# Patient Record
Sex: Female | Born: 1995
Health system: Southern US, Community
[De-identification: ages and names within clinical notes are randomized; demographics above are authoritative.]

## PROBLEM LIST (undated history)

## (undated) DIAGNOSIS — Z789 Other specified health status: Secondary | ICD-10-CM

## (undated) HISTORY — PX: NO PAST SURGERIES: SHX2092

## (undated) HISTORY — DX: Other specified health status: Z78.9

---
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
## 2017-06-19 ENCOUNTER — Ambulatory Visit: Payer: Self-pay | Admitting: Family Medicine

## 2017-06-28 ENCOUNTER — Ambulatory Visit (INDEPENDENT_AMBULATORY_CARE_PROVIDER_SITE_OTHER): Payer: PRIVATE HEALTH INSURANCE | Admitting: Family Medicine

## 2017-06-28 ENCOUNTER — Encounter: Payer: Self-pay | Admitting: Family Medicine

## 2017-06-28 VITALS — BP 90/62 | HR 77 | Temp 98.1°F | Ht 62.5 in | Wt 126.8 lb

## 2017-06-28 DIAGNOSIS — Z0000 Encounter for general adult medical examination without abnormal findings: Secondary | ICD-10-CM

## 2017-06-28 DIAGNOSIS — Z23 Encounter for immunization: Secondary | ICD-10-CM

## 2017-06-28 NOTE — Progress Notes (Addendum)
Subjective:     Peggy Becker is a 22 y.o. female and is here for a comprehensive physical exam. The patient reports no problems.  Patient states she is overall healthy.  States she really does not have her regular physician as she has not needed one.  Pt has high school diploma and is currently in school at [employer].  She is hoping to become a CNA.  She has 1 more year of school left.  Patient has a physical form that needs to be completed for school.  She also is in need of starting immunizations required for school.  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  She denies alcohol or tobacco use.  Patient endorses recreational drug use.  LMP May 31, 2017.  Family medical history: Mom-alive Dad-alive Sister-Courtney, alive MGM-alive, breast cancer MGF-alive PGM-deceased, breast cancer PGF-alive   Social History   Socioeconomic History  . Marital status: Single    Spouse name: Not on file  . Number of children: Not on file  . Years of education: Not on file  . Highest education level: Not on file  Social Needs  . Financial resource strain: Not on file  . Food insecurity - worry: Not on file  . Food insecurity - inability: Not on file  . Transportation needs - medical: Not on file  . Transportation needs - non-medical: Not on file  Occupational History  . Not on file  Tobacco Use  . Smoking status: Never Smoker  . Smokeless tobacco: Never Used  Substance and Sexual Activity  . Alcohol use: No    Frequency: Never  . Drug use: No  . Sexual activity: Not on file  Other Topics Concern  . Not on file  Social History Narrative  . Not on file   Health Maintenance  Topic Date Due  . HIV Screening  12/02/2011  . TETANUS/TDAP  12/02/2015  . INFLUENZA VACCINE  12/14/2016    The following portions of the patient's history were reviewed and updated as appropriate:  allergies, current medications, past family history, past medical history, past social history, past surgical history and problem list.  Review of Systems A comprehensive review of systems was negative.   Objective:    BP 90/62 (BP Location: Left Arm, Patient Position: Sitting, Cuff Size: Normal)   Pulse 77   Temp 98.1 F (36.7 C) (Oral)   Ht 5' 2.5" (1.588 m)   Wt 126 lb 12.8 oz (57.5 kg)   BMI 22.82 kg/m  General appearance: alert, cooperative and no distress Head: Normocephalic, without obvious abnormality, atraumatic Eyes: conjunctivae/corneas clear. PERRL, EOM's intact. Fundi benign. Ears: normal TM's and external ear canals both ears Nose: Nares normal. Septum midline. Mucosa normal. No drainage or sinus tenderness. Throat: lips, mucosa, and tongue normal; teeth and gums normal Neck: no adenopathy, no carotid bruit, no JVD, supple, symmetrical, trachea midline and thyroid not enlarged, symmetric, no tenderness/mass/nodules Lungs: clear to auscultation bilaterally Heart: regular rate and rhythm, S1, S2 normal, no murmur, click, rub or gallop Abdomen: soft, non-tender; bowel sounds normal; no masses,  no organomegaly Pelvic: deferred Extremities: extremities normal, atraumatic, no cyanosis or edema Pulses: 2+ and symmetric Skin: Skin color, texture, turgor normal. No rashes or lesions Lymph nodes: Cervical, supraclavicular, and axillary nodes normal. Neurologic: Alert and oriented X 3, normal strength and tone. Normal symmetric reflexes. Normal coordination and gait    Assessment:  Healthy female exam.  Without abnormal findings.     Plan:      Anticipatory guidance given including wearing seatbelts, smoke detectors in the home, increasing p.o. intake of water, increasing physical activity, increasing p.o. intake of vegetables -Immunizations reviewed.  Patient needs influenza vaccine, meningococcal vaccine and Quantiferon Gold for school.  Will administer  today -Physical forms completed for patient school -Pap not indicated See After Visit Summary for Counseling Recommendations    Follow-up PRN, sooner if needed.  Abbe AmsterdamShannon Denver Harder, MD

## 2017-06-28 NOTE — Addendum Note (Signed)
Addended by: Abbe AmsterdamBANKS, Dimonique Bourdeau R on: 06/28/2017 04:44 PM   Modules accepted: Orders

## 2017-06-28 NOTE — Patient Instructions (Addendum)
Preventive Care 18-39 Years, Female Preventive care refers to lifestyle choices and visits with your health care provider that can promote health and wellness. What does preventive care include?  A yearly physical exam. This is also called an annual well check.  Dental exams once or twice a year.  Routine eye exams. Ask your health care provider how often you should have your eyes checked.  Personal lifestyle choices, including: ? Daily care of your teeth and gums. ? Regular physical activity. ? Eating a healthy diet. ? Avoiding tobacco and drug use. ? Limiting alcohol use. ? Practicing safe sex. ? Taking vitamin and mineral supplements as recommended by your health care provider. What happens during an annual well check? The services and screenings done by your health care provider during your annual well check will depend on your age, overall health, lifestyle risk factors, and family history of disease. Counseling Your health care provider may ask you questions about your:  Alcohol use.  Tobacco use.  Drug use.  Emotional well-being.  Home and relationship well-being.  Sexual activity.  Eating habits.  Work and work Statistician.  Method of birth control.  Menstrual cycle.  Pregnancy history.  Screening You may have the following tests or measurements:  Height, weight, and BMI.  Diabetes screening. This is done by checking your blood sugar (glucose) after you have not eaten for a while (fasting).  Blood pressure.  Lipid and cholesterol levels. These may be checked every 5 years starting at age 66.  Skin check.  Hepatitis C blood test.  Hepatitis B blood test.  Sexually transmitted disease (STD) testing.  BRCA-related cancer screening. This may be done if you have a family history of breast, ovarian, tubal, or peritoneal cancers.  Pelvic exam and Pap test. This may be done every 3 years starting at age 40. Starting at age 59, this may be done every 5  years if you have a Pap test in combination with an HPV test.  Discuss your test results, treatment options, and if necessary, the need for more tests with your health care provider. Vaccines Your health care provider may recommend certain vaccines, such as:  Influenza vaccine. This is recommended every year.  Tetanus, diphtheria, and acellular pertussis (Tdap, Td) vaccine. You may need a Td booster every 10 years.  Varicella vaccine. You may need this if you have not been vaccinated.  HPV vaccine. If you are 69 or younger, you may need three doses over 6 months.  Measles, mumps, and rubella (MMR) vaccine. You may need at least one dose of MMR. You may also need a second dose.  Pneumococcal 13-valent conjugate (PCV13) vaccine. You may need this if you have certain conditions and were not previously vaccinated.  Pneumococcal polysaccharide (PPSV23) vaccine. You may need one or two doses if you smoke cigarettes or if you have certain conditions.  Meningococcal vaccine. One dose is recommended if you are age 27-21 years and a first-year college student living in a residence hall, or if you have one of several medical conditions. You may also need additional booster doses.  Hepatitis A vaccine. You may need this if you have certain conditions or if you travel or work in places where you may be exposed to hepatitis A.  Hepatitis B vaccine. You may need this if you have certain conditions or if you travel or work in places where you may be exposed to hepatitis B.  Haemophilus influenzae type b (Hib) vaccine. You may need this if  you have certain risk factors.  Talk to your health care provider about which screenings and vaccines you need and how often you need them. This information is not intended to replace advice given to you by your health care provider. Make sure you discuss any questions you have with your health care provider. Document Released: 06/28/2001 Document Revised: 01/20/2016  Document Reviewed: 03/03/2015 Elsevier Interactive Patient Education  [employer].

## 2017-06-28 NOTE — Addendum Note (Signed)
Addended by: Dominic PeaHOLSEY, Tajuana Kniskern V on: 06/28/2017 05:29 PM   Modules accepted: Orders

## 2017-06-30 LAB — QUANTIFERON-TB GOLD PLUS
Mitogen-NIL: >10 IU/mL
NIL: 0.05 IU/mL
QuantiFERON-TB Gold Plus: POSITIVE — AB
TB1-NIL: 0.2 IU/mL
TB2-NIL: 0.52 IU/mL

## 2017-07-03 ENCOUNTER — Telehealth: Payer: Self-pay | Admitting: Family Medicine

## 2017-07-03 NOTE — Telephone Encounter (Signed)
Copied from CRM [id number]. Topic: Quick Communication - Lab Results >> Jul 03, 2017 11:59 AM Stephannie LiSimmons, Tamma Brigandi L, NT wrote: Patient called for lab results  please call her at [phone] triage may disclose

## 2017-07-03 NOTE — Telephone Encounter (Addendum)
Attempted to call patient, left VM to return call for lab results. Results are in result notes.

## 2018-01-18 ENCOUNTER — Telehealth: Payer: Self-pay | Admitting: Family Medicine

## 2018-01-18 NOTE — Telephone Encounter (Unsigned)
Copied from CRM [id number]. Topic: Appointment Scheduling - Scheduling Inquiry for Clinic >> Jan 18, 2018  4:31 PM Baldo Daub L wrote: Reason for CRM:   Pt calling wanting to schedule a PPD test.  Pt can be reached at [phone] >> Jan 18, 2018  4:55 PM Nimmons, Amie Critchley, RN wrote: I spoke with pt, she claims she needs other vaccines, can we verify which ones she needs.

## 2018-01-22 NOTE — Telephone Encounter (Signed)
Called pt left a message to return call in the office to schedule a nurse visits for PPD/Vacccines

## 2018-01-24 ENCOUNTER — Ambulatory Visit: Payer: BLUE CROSS/BLUE SHIELD

## 2018-01-24 NOTE — Telephone Encounter (Signed)
Pt has been scheduled for the PPD/ Vaccines at [facility] location

## 2018-01-31 ENCOUNTER — Encounter: Payer: Self-pay | Admitting: Family Medicine

## 2018-01-31 ENCOUNTER — Ambulatory Visit (INDEPENDENT_AMBULATORY_CARE_PROVIDER_SITE_OTHER): Payer: BLUE CROSS/BLUE SHIELD | Admitting: Family Medicine

## 2018-01-31 VITALS — BP 100/68 | HR 82 | Temp 98.4°F | Wt 125.0 lb

## 2018-01-31 DIAGNOSIS — R7612 Nonspecific reaction to cell mediated immunity measurement of gamma interferon antigen response without active tuberculosis: Secondary | ICD-10-CM | POA: Diagnosis not present

## 2018-01-31 DIAGNOSIS — Z029 Encounter for administrative examinations, unspecified: Secondary | ICD-10-CM | POA: Diagnosis not present

## 2018-01-31 DIAGNOSIS — Z111 Encounter for screening for respiratory tuberculosis: Secondary | ICD-10-CM | POA: Diagnosis not present

## 2018-01-31 DIAGNOSIS — Z23 Encounter for immunization: Secondary | ICD-10-CM | POA: Diagnosis not present

## 2018-01-31 NOTE — Progress Notes (Signed)
Subjective:    Patient ID: Peggy Becker, female    DOB: [birth year], 22 y.o.   MRN: [id number]  No chief complaint on file.   HPI Patient was seen today for immunizations for school and paperwork.  History reviewed. No pertinent past medical history.  No Known Allergies  ROS General: Denies fever, chills, night sweats, changes in weight, changes in appetite HEENT: Denies headaches, ear pain, changes in vision, rhinorrhea, sore throat CV: Denies CP, palpitations, SOB, orthopnea Pulm: Denies SOB, cough, wheezing GI: Denies abdominal pain, nausea, vomiting, diarrhea, constipation GU: Denies dysuria, hematuria, frequency, vaginal discharge Msk: Denies muscle cramps, joint pains Neuro: Denies weakness, numbness, tingling Skin: Denies rashes, bruising Psych: Denies depression, anxiety, hallucinations     Objective:    Blood pressure 100/68, pulse 82, temperature 98.4 F (36.9 C), temperature source Oral, weight 125 lb (56.7 kg), SpO2 98 %.   Gen. Pleasant, well-nourished, in no distress, normal affect   Lungs: no accessory muscle use Cardiovascular: RRR, noperipheral edema Neuro:  A&Ox3, CN II-XII intact, normal gait  Wt Readings from Last 3 Encounters:  01/31/18 125 lb (56.7 kg)  06/28/17 126 lb 12.8 oz (57.5 kg)    No results found for: WBC, HGB, HCT, PLT, GLUCOSE, CHOL, TRIG, HDL, LDLDIRECT, LDLCALC, ALT, AST, NA, K, CL, CREATININE, BUN, CO2, TSH, PSA, INR, GLUF, HGBA1C, MICROALBUR  Assessment/Plan:  Encounters for administrative purpose -will complete immunization record form after results of Quantiferon Gold test. -pt given original form and copy placed in provider's box.  Need for immunization against influenza  - Plan: Flu Vaccine QUAD 36+ mos IM  Need for 23-polyvalent pneumococcal polysaccharide vaccine  - Plan: Pneumococcal polysaccharide vaccine 23-valent greater than or equal to 2yo subcutaneous/IM  Tuberculosis screening  - Plan: QuantiFERON-TB Gold  Plus   F/u prn  Abbe AmsterdamShannon Jayliana Valencia, MD

## 2018-02-05 LAB — QUANTIFERON-TB GOLD PLUS
Mitogen-NIL: >10 IU/mL
NIL: 0.04 IU/mL
QuantiFERON-TB Gold Plus: POSITIVE — AB
TB1-NIL: 0.39 IU/mL
TB2-NIL: 0.36 IU/mL

## 2018-02-07 ENCOUNTER — Other Ambulatory Visit: Payer: Self-pay | Admitting: Family Medicine

## 2018-02-07 DIAGNOSIS — R7612 Nonspecific reaction to cell mediated immunity measurement of gamma interferon antigen response without active tuberculosis: Secondary | ICD-10-CM

## 2018-02-22 DIAGNOSIS — N898 Other specified noninflammatory disorders of vagina: Secondary | ICD-10-CM | POA: Diagnosis not present

## 2018-02-22 DIAGNOSIS — N760 Acute vaginitis: Secondary | ICD-10-CM | POA: Diagnosis not present

## 2018-02-23 ENCOUNTER — Ambulatory Visit (INDEPENDENT_AMBULATORY_CARE_PROVIDER_SITE_OTHER)
Admission: RE | Admit: 2018-02-23 | Discharge: 2018-02-23 | Disposition: A | Discharge status: Home / Self Care | Payer: BLUE CROSS/BLUE SHIELD | Source: Ambulatory Visit | Attending: Family Medicine | Admitting: Family Medicine

## 2018-02-23 DIAGNOSIS — R7612 Nonspecific reaction to cell mediated immunity measurement of gamma interferon antigen response without active tuberculosis: Secondary | ICD-10-CM | POA: Diagnosis not present

## 2018-05-18 DIAGNOSIS — N898 Other specified noninflammatory disorders of vagina: Secondary | ICD-10-CM | POA: Diagnosis not present

## 2018-06-19 DIAGNOSIS — O3680X9 Pregnancy with inconclusive fetal viability, other fetus: Secondary | ICD-10-CM | POA: Diagnosis not present

## 2018-06-19 DIAGNOSIS — N925 Other specified irregular menstruation: Secondary | ICD-10-CM | POA: Diagnosis not present

## 2018-06-19 DIAGNOSIS — Z369 Encounter for antenatal screening, unspecified: Secondary | ICD-10-CM | POA: Diagnosis not present

## 2018-06-19 DIAGNOSIS — B373 Candidiasis of vulva and vagina: Secondary | ICD-10-CM | POA: Diagnosis not present

## 2018-06-21 DIAGNOSIS — N925 Other specified irregular menstruation: Secondary | ICD-10-CM | POA: Diagnosis not present

## 2018-06-25 DIAGNOSIS — O3680X9 Pregnancy with inconclusive fetal viability, other fetus: Secondary | ICD-10-CM | POA: Diagnosis not present

## 2018-07-03 DIAGNOSIS — N760 Acute vaginitis: Secondary | ICD-10-CM | POA: Diagnosis not present

## 2018-07-03 DIAGNOSIS — N898 Other specified noninflammatory disorders of vagina: Secondary | ICD-10-CM | POA: Diagnosis not present

## 2018-07-03 DIAGNOSIS — B373 Candidiasis of vulva and vagina: Secondary | ICD-10-CM | POA: Diagnosis not present

## 2018-07-03 DIAGNOSIS — Z113 Encounter for screening for infections with a predominantly sexual mode of transmission: Secondary | ICD-10-CM | POA: Diagnosis not present

## 2018-07-03 DIAGNOSIS — O039 Complete or unspecified spontaneous abortion without complication: Secondary | ICD-10-CM | POA: Diagnosis not present

## 2018-07-03 DIAGNOSIS — Z3040 Encounter for surveillance of contraceptives, unspecified: Secondary | ICD-10-CM | POA: Diagnosis not present

## 2019-03-11 LAB — OB RESULTS CONSOLE HIV ANTIBODY (ROUTINE TESTING): HIV: NONREACTIVE

## 2019-03-11 LAB — OB RESULTS CONSOLE HEPATITIS B SURFACE ANTIGEN: Hepatitis B Surface Ag: NEGATIVE

## 2019-03-11 LAB — OB RESULTS CONSOLE RPR: RPR: NONREACTIVE

## 2019-03-11 LAB — OB RESULTS CONSOLE RUBELLA ANTIBODY, IGM: Rubella: IMMUNE

## 2019-06-26 ENCOUNTER — Other Ambulatory Visit (HOSPITAL_COMMUNITY): Payer: Self-pay | Admitting: Obstetrics and Gynecology

## 2019-06-26 DIAGNOSIS — Z363 Encounter for antenatal screening for malformations: Secondary | ICD-10-CM

## 2019-06-26 DIAGNOSIS — O365932 Maternal care for other known or suspected poor fetal growth, third trimester, fetus 2: Secondary | ICD-10-CM

## 2019-06-26 DIAGNOSIS — O365931 Maternal care for other known or suspected poor fetal growth, third trimester, fetus 1: Secondary | ICD-10-CM

## 2019-06-26 DIAGNOSIS — O30043 Twin pregnancy, dichorionic/diamniotic, third trimester: Secondary | ICD-10-CM

## 2019-06-27 ENCOUNTER — Encounter (HOSPITAL_COMMUNITY): Payer: Self-pay | Admitting: *Deleted

## 2019-06-28 ENCOUNTER — Encounter (HOSPITAL_COMMUNITY): Payer: Self-pay

## 2019-06-28 ENCOUNTER — Other Ambulatory Visit (HOSPITAL_COMMUNITY): Payer: Self-pay | Admitting: Obstetrics and Gynecology

## 2019-06-28 ENCOUNTER — Ambulatory Visit (HOSPITAL_COMMUNITY): Payer: No Typology Code available for payment source | Admitting: *Deleted

## 2019-06-28 ENCOUNTER — Other Ambulatory Visit: Payer: Self-pay

## 2019-06-28 ENCOUNTER — Ambulatory Visit (HOSPITAL_BASED_OUTPATIENT_CLINIC_OR_DEPARTMENT_OTHER): Payer: No Typology Code available for payment source | Admitting: Obstetrics and Gynecology

## 2019-06-28 ENCOUNTER — Ambulatory Visit (HOSPITAL_COMMUNITY)
Admission: RE | Admit: 2019-06-28 | Discharge: 2019-06-28 | Disposition: A | Discharge status: Home / Self Care | Payer: No Typology Code available for payment source | Source: Ambulatory Visit | Attending: Obstetrics and Gynecology | Admitting: Obstetrics and Gynecology

## 2019-06-28 VITALS — BP 118/54 | HR 89 | Temp 97.7°F

## 2019-06-28 DIAGNOSIS — Z3A29 29 weeks gestation of pregnancy: Secondary | ICD-10-CM | POA: Diagnosis not present

## 2019-06-28 DIAGNOSIS — Z363 Encounter for antenatal screening for malformations: Secondary | ICD-10-CM | POA: Diagnosis present

## 2019-06-28 DIAGNOSIS — O30043 Twin pregnancy, dichorionic/diamniotic, third trimester: Secondary | ICD-10-CM

## 2019-06-28 DIAGNOSIS — O365932 Maternal care for other known or suspected poor fetal growth, third trimester, fetus 2: Secondary | ICD-10-CM | POA: Diagnosis not present

## 2019-06-28 DIAGNOSIS — O30049 Twin pregnancy, dichorionic/diamniotic, unspecified trimester: Secondary | ICD-10-CM | POA: Diagnosis present

## 2019-06-28 DIAGNOSIS — O365931 Maternal care for other known or suspected poor fetal growth, third trimester, fetus 1: Secondary | ICD-10-CM

## 2019-06-28 NOTE — Progress Notes (Signed)
MFM Note  This patient was seen in consultation today due to possible fetal growth restriction in a dichorionic twin gestation.  This is a spontaneously conceived twin gestation.  The patient denies any significant past medical history and denies any other problems in her current pregnancy.     On today's exam, the overall EFW for twin A measured the 12th percentile for her gestational age.  The EFW for twin B measured at the 20th percentile for her gestational age.    There was normal amniotic fluid noted around both twin A and twin B today.  A 5% discordance was noted in the overall weights obtained for both twin A and twin B.  Doppler studies of the umbilical arteries performed for twin A and twin B elevated S/D ratios, although forward diastolic flow continues to be noted in both fetuses.  There were no signs of absent or reversed end-diastolic flow noted in either fetus.  The views of the fetal anatomy's for both fetuses were limited due to her advanced gestational age.  The management of dichorionic twins was discussed.  She was advised that management of twin pregnancies will involve frequent ultrasound exams to assess the fetal growth and amniotic fluid levels.   As the overall EFW's for both fetuses measure in the lower normal range and due to the elevated umbilical artery Doppler studies noted in both fetuses today, we will continue to follow her with weekly umbilical artery Doppler studies.  We will also start weekly fetal testing with nonstress tests next week.    Should absent or reversed end-diastolic flow be noted in the umbilical arteries during her future exams, a course of antenatal corticosteroids should be administered at that time.  A course of antenatal corticosteroids would also be indicated should the overall EFW's be less than the 10th percentile during her future exams and she is at risk for a preterm delivery.  The patient was advised that delivery for uncomplicated  dichorionic twins is usually recommended at around 38 weeks.  However should growth restriction be noted in a twin gestation with normal umbilical artery Doppler studies, delivery at around 36 to 37 weeks may be recommended.  Delivery at between 32 to 34 weeks may be recommended if fetal growth restriction is noted with either absent or reversed end-diastolic flow.  A follow-up exam was scheduled in one week for a nonstress test and umbilical artery Doppler studies.    At the end of the consultation, the patient stated that all her questions have been answered to her complete satisfaction.  Thank you for referring this patient for a Maternal-Fetal Medicine consultation.    A total of 32 minutes was spent counseling and coordinating the care for this patient.  Greater than 50% of the time was spent in direct face-to-face contact.

## 2019-07-03 ENCOUNTER — Other Ambulatory Visit (HOSPITAL_COMMUNITY): Payer: Self-pay | Admitting: *Deleted

## 2019-07-03 DIAGNOSIS — O30049 Twin pregnancy, dichorionic/diamniotic, unspecified trimester: Secondary | ICD-10-CM

## 2019-07-05 ENCOUNTER — Ambulatory Visit (HOSPITAL_COMMUNITY)
Admission: RE | Admit: 2019-07-05 | Discharge: 2019-07-05 | Disposition: A | Discharge status: Home / Self Care | Payer: PRIVATE HEALTH INSURANCE | Source: Ambulatory Visit | Attending: Obstetrics and Gynecology | Admitting: Obstetrics and Gynecology

## 2019-07-05 ENCOUNTER — Ambulatory Visit (HOSPITAL_COMMUNITY): Payer: PRIVATE HEALTH INSURANCE | Admitting: *Deleted

## 2019-07-05 ENCOUNTER — Encounter (HOSPITAL_COMMUNITY): Payer: Self-pay

## 2019-07-05 ENCOUNTER — Other Ambulatory Visit (HOSPITAL_COMMUNITY): Payer: Self-pay | Admitting: Obstetrics

## 2019-07-05 ENCOUNTER — Other Ambulatory Visit: Payer: Self-pay

## 2019-07-05 VITALS — BP 132/75 | HR 103 | Temp 97.6°F

## 2019-07-05 DIAGNOSIS — Z3A30 30 weeks gestation of pregnancy: Secondary | ICD-10-CM

## 2019-07-05 DIAGNOSIS — O30043 Twin pregnancy, dichorionic/diamniotic, third trimester: Secondary | ICD-10-CM

## 2019-07-05 DIAGNOSIS — O30049 Twin pregnancy, dichorionic/diamniotic, unspecified trimester: Secondary | ICD-10-CM | POA: Diagnosis not present

## 2019-07-08 ENCOUNTER — Other Ambulatory Visit (HOSPITAL_COMMUNITY): Payer: Self-pay | Admitting: *Deleted

## 2019-07-08 DIAGNOSIS — O365930 Maternal care for other known or suspected poor fetal growth, third trimester, not applicable or unspecified: Secondary | ICD-10-CM

## 2019-07-09 ENCOUNTER — Encounter (HOSPITAL_COMMUNITY): Payer: Self-pay | Admitting: Obstetrics and Gynecology

## 2019-07-09 ENCOUNTER — Other Ambulatory Visit (HOSPITAL_COMMUNITY): Payer: Self-pay | Admitting: Obstetrics and Gynecology

## 2019-07-12 ENCOUNTER — Ambulatory Visit (HOSPITAL_COMMUNITY)
Admission: RE | Admit: 2019-07-12 | Discharge: 2019-07-12 | Disposition: A | Discharge status: Home / Self Care | Payer: PRIVATE HEALTH INSURANCE | Source: Ambulatory Visit | Attending: Obstetrics and Gynecology | Admitting: Obstetrics and Gynecology

## 2019-07-12 ENCOUNTER — Other Ambulatory Visit (HOSPITAL_COMMUNITY): Payer: PRIVATE HEALTH INSURANCE

## 2019-07-12 ENCOUNTER — Ambulatory Visit (HOSPITAL_COMMUNITY): Payer: PRIVATE HEALTH INSURANCE | Admitting: *Deleted

## 2019-07-12 ENCOUNTER — Other Ambulatory Visit: Payer: Self-pay

## 2019-07-12 ENCOUNTER — Encounter (HOSPITAL_COMMUNITY): Payer: Self-pay

## 2019-07-12 VITALS — BP 128/78 | HR 96 | Temp 97.2°F

## 2019-07-12 DIAGNOSIS — Z3A31 31 weeks gestation of pregnancy: Secondary | ICD-10-CM | POA: Diagnosis not present

## 2019-07-12 DIAGNOSIS — O30049 Twin pregnancy, dichorionic/diamniotic, unspecified trimester: Secondary | ICD-10-CM

## 2019-07-12 DIAGNOSIS — O365930 Maternal care for other known or suspected poor fetal growth, third trimester, not applicable or unspecified: Secondary | ICD-10-CM | POA: Insufficient documentation

## 2019-07-12 DIAGNOSIS — O30043 Twin pregnancy, dichorionic/diamniotic, third trimester: Secondary | ICD-10-CM

## 2019-07-12 DIAGNOSIS — O365931 Maternal care for other known or suspected poor fetal growth, third trimester, fetus 1: Secondary | ICD-10-CM

## 2019-07-15 ENCOUNTER — Other Ambulatory Visit (HOSPITAL_COMMUNITY): Payer: Self-pay | Admitting: *Deleted

## 2019-07-15 DIAGNOSIS — O365930 Maternal care for other known or suspected poor fetal growth, third trimester, not applicable or unspecified: Secondary | ICD-10-CM

## 2019-07-19 ENCOUNTER — Encounter (HOSPITAL_COMMUNITY): Payer: Self-pay

## 2019-07-19 ENCOUNTER — Ambulatory Visit (HOSPITAL_COMMUNITY)
Admission: RE | Admit: 2019-07-19 | Discharge: 2019-07-19 | Disposition: A | Discharge status: Home / Self Care | Payer: No Typology Code available for payment source | Source: Ambulatory Visit | Attending: Obstetrics and Gynecology | Admitting: Obstetrics and Gynecology

## 2019-07-19 ENCOUNTER — Ambulatory Visit (HOSPITAL_COMMUNITY): Payer: No Typology Code available for payment source | Admitting: *Deleted

## 2019-07-19 ENCOUNTER — Other Ambulatory Visit: Payer: Self-pay

## 2019-07-19 VITALS — BP 134/83 | HR 84 | Temp 97.4°F

## 2019-07-19 DIAGNOSIS — Z3A32 32 weeks gestation of pregnancy: Secondary | ICD-10-CM | POA: Diagnosis not present

## 2019-07-19 DIAGNOSIS — Z362 Encounter for other antenatal screening follow-up: Secondary | ICD-10-CM

## 2019-07-19 DIAGNOSIS — O30049 Twin pregnancy, dichorionic/diamniotic, unspecified trimester: Secondary | ICD-10-CM

## 2019-07-19 DIAGNOSIS — O365930 Maternal care for other known or suspected poor fetal growth, third trimester, not applicable or unspecified: Secondary | ICD-10-CM | POA: Insufficient documentation

## 2019-07-22 ENCOUNTER — Other Ambulatory Visit (HOSPITAL_COMMUNITY): Payer: Self-pay | Admitting: *Deleted

## 2019-07-22 DIAGNOSIS — O365931 Maternal care for other known or suspected poor fetal growth, third trimester, fetus 1: Secondary | ICD-10-CM

## 2019-07-23 ENCOUNTER — Encounter (HOSPITAL_COMMUNITY): Payer: Self-pay | Admitting: Obstetrics and Gynecology

## 2019-07-23 ENCOUNTER — Inpatient Hospital Stay (HOSPITAL_COMMUNITY): Payer: No Typology Code available for payment source | Admitting: Certified Registered Nurse Anesthetist

## 2019-07-23 ENCOUNTER — Inpatient Hospital Stay (HOSPITAL_COMMUNITY)
Admission: AD | Admit: 2019-07-23 | Discharge: 2019-07-26 | DRG: 788 | Disposition: A | Discharge status: Home / Self Care | Payer: No Typology Code available for payment source | Attending: Obstetrics & Gynecology | Admitting: Obstetrics & Gynecology

## 2019-07-23 ENCOUNTER — Encounter (HOSPITAL_COMMUNITY)
Admission: AD | Disposition: A | Discharge status: Home / Self Care | Payer: Self-pay | Source: Home / Self Care | Attending: Obstetrics & Gynecology

## 2019-07-23 ENCOUNTER — Inpatient Hospital Stay (HOSPITAL_BASED_OUTPATIENT_CLINIC_OR_DEPARTMENT_OTHER): Payer: No Typology Code available for payment source

## 2019-07-23 ENCOUNTER — Other Ambulatory Visit: Payer: Self-pay

## 2019-07-23 DIAGNOSIS — R109 Unspecified abdominal pain: Secondary | ICD-10-CM | POA: Diagnosis present

## 2019-07-23 DIAGNOSIS — O30043 Twin pregnancy, dichorionic/diamniotic, third trimester: Secondary | ICD-10-CM | POA: Diagnosis present

## 2019-07-23 DIAGNOSIS — O365932 Maternal care for other known or suspected poor fetal growth, third trimester, fetus 2: Secondary | ICD-10-CM | POA: Diagnosis present

## 2019-07-23 DIAGNOSIS — Z20822 Contact with and (suspected) exposure to covid-19: Secondary | ICD-10-CM | POA: Diagnosis present

## 2019-07-23 DIAGNOSIS — Z3A32 32 weeks gestation of pregnancy: Secondary | ICD-10-CM

## 2019-07-23 DIAGNOSIS — O4593 Premature separation of placenta, unspecified, third trimester: Secondary | ICD-10-CM | POA: Diagnosis present

## 2019-07-23 DIAGNOSIS — O365931 Maternal care for other known or suspected poor fetal growth, third trimester, fetus 1: Secondary | ICD-10-CM | POA: Diagnosis present

## 2019-07-23 DIAGNOSIS — O30003 Twin pregnancy, unspecified number of placenta and unspecified number of amniotic sacs, third trimester: Secondary | ICD-10-CM | POA: Diagnosis present

## 2019-07-23 DIAGNOSIS — O99892 Other specified diseases and conditions complicating childbirth: Secondary | ICD-10-CM | POA: Diagnosis not present

## 2019-07-23 DIAGNOSIS — O4693 Antepartum hemorrhage, unspecified, third trimester: Secondary | ICD-10-CM | POA: Diagnosis not present

## 2019-07-23 DIAGNOSIS — O4590 Premature separation of placenta, unspecified, unspecified trimester: Secondary | ICD-10-CM | POA: Diagnosis present

## 2019-07-23 LAB — CBC
HCT: 36.7 % (ref 36.0–46.0)
HCT: 29.6 % — ABNORMAL LOW (ref 36.0–46.0)
Hemoglobin: 11.7 g/dL — ABNORMAL LOW (ref 12.0–15.0)
Hemoglobin: 9.5 g/dL — ABNORMAL LOW (ref 12.0–15.0)
MCH: 29.9 pg (ref 26.0–34.0)
MCH: 29.8 pg (ref 26.0–34.0)
MCHC: 31.9 g/dL (ref 30.0–36.0)
MCHC: 32.1 g/dL (ref 30.0–36.0)
MCV: 93.9 fL (ref 80.0–100.0)
MCV: 92.8 fL (ref 80.0–100.0)
Platelets: 197 10*3/uL (ref 150–400)
Platelets: 175 10*3/uL (ref 150–400)
RBC: 3.91 MIL/uL (ref 3.87–5.11)
RBC: 3.19 MIL/uL — ABNORMAL LOW (ref 3.87–5.11)
RDW: 15.6 % — ABNORMAL HIGH (ref 11.5–15.5)
RDW: 15.4 % (ref 11.5–15.5)
WBC: 12.1 10*3/uL — ABNORMAL HIGH (ref 4.0–10.5)
WBC: 15.3 10*3/uL — ABNORMAL HIGH (ref 4.0–10.5)
nRBC: 0.2 % (ref 0.0–0.2)
nRBC: 0 % (ref 0.0–0.2)

## 2019-07-23 LAB — RESPIRATORY PANEL BY RT PCR (FLU A&B, COVID)
Influenza A by PCR: NEGATIVE
Influenza B by PCR: NEGATIVE
SARS Coronavirus 2 by RT PCR: NEGATIVE

## 2019-07-23 LAB — ABO/RH: ABO/RH(D): B POS

## 2019-07-23 SURGERY — Surgical Case
Anesthesia: General

## 2019-07-23 MED ORDER — FERROUS SULFATE 325 (65 FE) MG PO TABS
325.0000 mg | ORAL_TABLET | Freq: Two times a day (BID) | ORAL | Status: DC
  Administered 2019-07-23 – 2019-07-26 (×5): 325 mg via ORAL
  Filled 2019-07-23 (×6): qty 1

## 2019-07-23 MED ORDER — PRENATAL MULTIVITAMIN CH
1.0000 | ORAL_TABLET | Freq: Every day | ORAL | Status: DC
  Administered 2019-07-23 – 2019-07-26 (×3): 1 via ORAL
  Filled 2019-07-23 (×4): qty 1

## 2019-07-23 MED ORDER — DIBUCAINE (PERIANAL) 1 % EX OINT: 1.0000 "application " | TOPICAL_OINTMENT | CUTANEOUS | Status: DC | PRN

## 2019-07-23 MED ORDER — SODIUM CHLORIDE 0.9% FLUSH
3.0000 mL | INTRAVENOUS | Status: DC | PRN
  Administered 2019-07-25: 3 mL via INTRAVENOUS

## 2019-07-23 MED ORDER — LACTATED RINGERS IV SOLN: INTRAVENOUS | Status: DC

## 2019-07-23 MED ORDER — HYDROMORPHONE HCL 1 MG/ML IJ SOLN
INTRAMUSCULAR | Status: AC
  Filled 2019-07-23: qty 1

## 2019-07-23 MED ORDER — ACETAMINOPHEN 10 MG/ML IV SOLN
1000.0000 mg | Freq: Once | INTRAVENOUS | Status: DC | PRN
  Administered 2019-07-23: 1000 mg via INTRAVENOUS

## 2019-07-23 MED ORDER — HYDROMORPHONE HCL 1 MG/ML IJ SOLN
INTRAMUSCULAR | Status: AC
  Filled 2019-07-23: qty 0.5

## 2019-07-23 MED ORDER — IBUPROFEN 600 MG PO TABS
600.0000 mg | ORAL_TABLET | Freq: Four times a day (QID) | ORAL | Status: DC
  Administered 2019-07-23 – 2019-07-26 (×9): 600 mg via ORAL
  Filled 2019-07-23 (×13): qty 1

## 2019-07-23 MED ORDER — SIMETHICONE 80 MG PO CHEW
80.0000 mg | CHEWABLE_TABLET | Freq: Three times a day (TID) | ORAL | Status: DC
  Administered 2019-07-23 – 2019-07-26 (×7): 80 mg via ORAL
  Filled 2019-07-23 (×8): qty 1

## 2019-07-23 MED ORDER — SCOPOLAMINE 1 MG/3DAYS TD PT72
1.0000 | MEDICATED_PATCH | Freq: Once | TRANSDERMAL | Status: AC
  Administered 2019-07-23: 1.5 mg via TRANSDERMAL
  Filled 2019-07-23: qty 1

## 2019-07-23 MED ORDER — HYDROMORPHONE HCL 1 MG/ML IJ SOLN
0.2500 mg | INTRAMUSCULAR | Status: DC | PRN
  Administered 2019-07-23 (×2): 0.5 mg via INTRAVENOUS

## 2019-07-23 MED ORDER — FENTANYL CITRATE (PF) 250 MCG/5ML IJ SOLN
INTRAMUSCULAR | Status: AC
  Filled 2019-07-23: qty 5

## 2019-07-23 MED ORDER — CEFAZOLIN SODIUM-DEXTROSE 2-3 GM-%(50ML) IV SOLR
INTRAVENOUS | Status: DC | PRN
  Administered 2019-07-23: 2 g via INTRAVENOUS

## 2019-07-23 MED ORDER — SODIUM CHLORIDE 0.9 % IV SOLN
3.0000 g | Freq: Four times a day (QID) | INTRAVENOUS | Status: AC
  Administered 2019-07-23 – 2019-07-24 (×3): 3 g via INTRAVENOUS
  Filled 2019-07-23 (×3): qty 8

## 2019-07-23 MED ORDER — SUCCINYLCHOLINE CHLORIDE 20 MG/ML IJ SOLN
INTRAMUSCULAR | Status: DC | PRN
  Administered 2019-07-23: 160 mg via INTRAVENOUS

## 2019-07-23 MED ORDER — SODIUM CHLORIDE 0.9 % IV SOLN: INTRAVENOUS | Status: DC | PRN

## 2019-07-23 MED ORDER — ACETAMINOPHEN 500 MG PO TABS: 1000.0000 mg | ORAL_TABLET | Freq: Four times a day (QID) | ORAL | Status: DC

## 2019-07-23 MED ORDER — ONDANSETRON HCL 4 MG/2ML IJ SOLN
INTRAMUSCULAR | Status: DC | PRN
  Administered 2019-07-23: 4 mg via INTRAVENOUS

## 2019-07-23 MED ORDER — PROPOFOL 10 MG/ML IV BOLUS
INTRAVENOUS | Status: AC
  Filled 2019-07-23: qty 20

## 2019-07-23 MED ORDER — MIDAZOLAM HCL 2 MG/2ML IJ SOLN
INTRAMUSCULAR | Status: AC
  Filled 2019-07-23: qty 2

## 2019-07-23 MED ORDER — OXYTOCIN 40 UNITS IN NORMAL SALINE INFUSION - SIMPLE MED: 2.5000 [IU]/h | INTRAVENOUS | Status: AC

## 2019-07-23 MED ORDER — PROPOFOL 10 MG/ML IV BOLUS
INTRAVENOUS | Status: DC | PRN
  Administered 2019-07-23: 150 mg via INTRAVENOUS

## 2019-07-23 MED ORDER — NALOXONE HCL 4 MG/10ML IJ SOLN
1.0000 ug/kg/h | INTRAVENOUS | Status: DC | PRN
  Filled 2019-07-23: qty 5

## 2019-07-23 MED ORDER — PHENYLEPHRINE 40 MCG/ML (10ML) SYRINGE FOR IV PUSH (FOR BLOOD PRESSURE SUPPORT)
PREFILLED_SYRINGE | INTRAVENOUS | Status: AC
  Filled 2019-07-23: qty 10

## 2019-07-23 MED ORDER — SIMETHICONE 80 MG PO CHEW: 80.0000 mg | CHEWABLE_TABLET | ORAL | Status: DC | PRN

## 2019-07-23 MED ORDER — METHYLERGONOVINE MALEATE 0.2 MG/ML IJ SOLN: 0.2000 mg | INTRAMUSCULAR | Status: DC | PRN

## 2019-07-23 MED ORDER — MEASLES, MUMPS & RUBELLA VAC IJ SOLR: 0.5000 mL | Freq: Once | INTRAMUSCULAR | Status: DC

## 2019-07-23 MED ORDER — SODIUM CHLORIDE 0.9 % IR SOLN
Status: DC | PRN
  Administered 2019-07-23: 1

## 2019-07-23 MED ORDER — KETOROLAC TROMETHAMINE 30 MG/ML IJ SOLN
30.0000 mg | Freq: Four times a day (QID) | INTRAMUSCULAR | Status: AC | PRN
  Administered 2019-07-23: 30 mg via INTRAMUSCULAR

## 2019-07-23 MED ORDER — ONDANSETRON HCL 4 MG/2ML IJ SOLN
4.0000 mg | Freq: Three times a day (TID) | INTRAMUSCULAR | Status: DC | PRN
  Administered 2019-07-23: 4 mg via INTRAVENOUS
  Filled 2019-07-23: qty 2

## 2019-07-23 MED ORDER — METHYLERGONOVINE MALEATE 0.2 MG/ML IJ SOLN
INTRAMUSCULAR | Status: AC
  Filled 2019-07-23: qty 1

## 2019-07-23 MED ORDER — DIPHENHYDRAMINE HCL 25 MG PO CAPS: 25.0000 mg | ORAL_CAPSULE | Freq: Four times a day (QID) | ORAL | Status: DC | PRN

## 2019-07-23 MED ORDER — MIDAZOLAM HCL 2 MG/2ML IJ SOLN
INTRAMUSCULAR | Status: DC | PRN
  Administered 2019-07-23: 2 mg via INTRAVENOUS

## 2019-07-23 MED ORDER — PHENYLEPHRINE HCL (PRESSORS) 10 MG/ML IV SOLN
INTRAVENOUS | Status: DC | PRN
  Administered 2019-07-23 (×2): 40 ug via INTRAVENOUS

## 2019-07-23 MED ORDER — OXYTOCIN 40 UNITS IN NORMAL SALINE INFUSION - SIMPLE MED
INTRAVENOUS | Status: DC | PRN
  Administered 2019-07-23: 40 [IU] via INTRAVENOUS

## 2019-07-23 MED ORDER — OXYCODONE HCL 5 MG PO TABS
5.0000 mg | ORAL_TABLET | ORAL | Status: DC | PRN
  Administered 2019-07-23: 10 mg via ORAL
  Administered 2019-07-23: 5 mg via ORAL
  Administered 2019-07-24 – 2019-07-25 (×4): 10 mg via ORAL
  Filled 2019-07-23 (×6): qty 2

## 2019-07-23 MED ORDER — FENTANYL CITRATE (PF) 100 MCG/2ML IJ SOLN: 50.0000 ug | INTRAMUSCULAR | Status: AC | PRN

## 2019-07-23 MED ORDER — DIPHENHYDRAMINE HCL 25 MG PO CAPS: 25.0000 mg | ORAL_CAPSULE | ORAL | Status: DC | PRN

## 2019-07-23 MED ORDER — NALBUPHINE HCL 10 MG/ML IJ SOLN: 5.0000 mg | INTRAMUSCULAR | Status: DC | PRN

## 2019-07-23 MED ORDER — FENTANYL CITRATE (PF) 100 MCG/2ML IJ SOLN
INTRAMUSCULAR | Status: DC | PRN
  Administered 2019-07-23: 100 ug via INTRAVENOUS

## 2019-07-23 MED ORDER — METHYLERGONOVINE MALEATE 0.2 MG PO TABS: 0.2000 mg | ORAL_TABLET | ORAL | Status: DC | PRN

## 2019-07-23 MED ORDER — DIPHENHYDRAMINE HCL 50 MG/ML IJ SOLN: 12.5000 mg | INTRAMUSCULAR | Status: DC | PRN

## 2019-07-23 MED ORDER — KETOROLAC TROMETHAMINE 30 MG/ML IJ SOLN: 30.0000 mg | Freq: Once | INTRAMUSCULAR | Status: DC | PRN

## 2019-07-23 MED ORDER — SENNOSIDES-DOCUSATE SODIUM 8.6-50 MG PO TABS
2.0000 | ORAL_TABLET | ORAL | Status: DC
  Administered 2019-07-23 – 2019-07-26 (×3): 2 via ORAL
  Filled 2019-07-23 (×3): qty 2

## 2019-07-23 MED ORDER — CEFAZOLIN SODIUM-DEXTROSE 2-4 GM/100ML-% IV SOLN
INTRAVENOUS | Status: AC
  Filled 2019-07-23: qty 100

## 2019-07-23 MED ORDER — METHYLERGONOVINE MALEATE 0.2 MG/ML IJ SOLN
INTRAMUSCULAR | Status: DC | PRN
  Administered 2019-07-23: .2 mg via INTRAMUSCULAR

## 2019-07-23 MED ORDER — TETANUS-DIPHTH-ACELL PERTUSSIS 5-2.5-18.5 LF-MCG/0.5 IM SUSP: 0.5000 mL | Freq: Once | INTRAMUSCULAR | Status: DC

## 2019-07-23 MED ORDER — OXYCODONE HCL 5 MG PO TABS
5.0000 mg | ORAL_TABLET | ORAL | Status: AC | PRN
  Filled 2019-07-23: qty 1

## 2019-07-23 MED ORDER — ZOLPIDEM TARTRATE 5 MG PO TABS: 5.0000 mg | ORAL_TABLET | Freq: Every evening | ORAL | Status: DC | PRN

## 2019-07-23 MED ORDER — DEXAMETHASONE SODIUM PHOSPHATE 10 MG/ML IJ SOLN
INTRAMUSCULAR | Status: DC | PRN
  Administered 2019-07-23: 10 mg via INTRAVENOUS

## 2019-07-23 MED ORDER — NALOXONE HCL 0.4 MG/ML IJ SOLN: 0.4000 mg | INTRAMUSCULAR | Status: DC | PRN

## 2019-07-23 MED ORDER — MENTHOL 3 MG MT LOZG: 1.0000 | LOZENGE | OROMUCOSAL | Status: DC | PRN

## 2019-07-23 MED ORDER — LACTATED RINGERS IV SOLN: INTRAVENOUS | Status: DC | PRN

## 2019-07-23 MED ORDER — LIDOCAINE 2% (20 MG/ML) 5 ML SYRINGE
INTRAMUSCULAR | Status: AC
  Filled 2019-07-23: qty 5

## 2019-07-23 MED ORDER — KETOROLAC TROMETHAMINE 30 MG/ML IJ SOLN
INTRAMUSCULAR | Status: AC
  Filled 2019-07-23: qty 1

## 2019-07-23 MED ORDER — KETOROLAC TROMETHAMINE 30 MG/ML IJ SOLN: 30.0000 mg | Freq: Four times a day (QID) | INTRAMUSCULAR | Status: AC | PRN

## 2019-07-23 MED ORDER — WITCH HAZEL-GLYCERIN EX PADS: 1.0000 "application " | MEDICATED_PAD | CUTANEOUS | Status: DC | PRN

## 2019-07-23 MED ORDER — NALBUPHINE HCL 10 MG/ML IJ SOLN: 5.0000 mg | Freq: Once | INTRAMUSCULAR | Status: DC | PRN

## 2019-07-23 MED ORDER — SIMETHICONE 80 MG PO CHEW
80.0000 mg | CHEWABLE_TABLET | ORAL | Status: DC
  Administered 2019-07-23 – 2019-07-26 (×3): 80 mg via ORAL
  Filled 2019-07-23 (×3): qty 1

## 2019-07-23 MED ORDER — COCONUT OIL OIL
1.0000 "application " | TOPICAL_OIL | Status: DC | PRN
  Administered 2019-07-24: 1 via TOPICAL

## 2019-07-23 MED ORDER — ACETAMINOPHEN 10 MG/ML IV SOLN
INTRAVENOUS | Status: AC
  Filled 2019-07-23: qty 100

## 2019-07-23 MED ORDER — FENTANYL CITRATE (PF) 100 MCG/2ML IJ SOLN: 25.0000 ug | INTRAMUSCULAR | Status: DC | PRN

## 2019-07-23 MED ORDER — HYDROMORPHONE HCL 1 MG/ML IJ SOLN
INTRAMUSCULAR | Status: DC | PRN
  Administered 2019-07-23 (×2): .5 mg via INTRAVENOUS

## 2019-07-23 MED ORDER — ACETAMINOPHEN 500 MG PO TABS
1000.0000 mg | ORAL_TABLET | Freq: Four times a day (QID) | ORAL | Status: DC
  Administered 2019-07-23 – 2019-07-26 (×7): 1000 mg via ORAL
  Filled 2019-07-23 (×11): qty 2

## 2019-07-23 SURGICAL SUPPLY — 43 items
ADH SKN CLS APL DERMABOND .7 (GAUZE/BANDAGES/DRESSINGS) ×2
APL SKNCLS STERI-STRIP NONHPOA (GAUZE/BANDAGES/DRESSINGS) ×1
BENZOIN TINCTURE PRP APPL 2/3 (GAUZE/BANDAGES/DRESSINGS) ×1 IMPLANT
CHLORAPREP W/TINT 26ML (MISCELLANEOUS) ×2 IMPLANT
CLAMP CORD UMBIL (MISCELLANEOUS) IMPLANT
CLOTH BEACON ORANGE TIMEOUT ST (SAFETY) ×2 IMPLANT
DERMABOND ADVANCED (GAUZE/BANDAGES/DRESSINGS) ×2
DERMABOND ADVANCED .7 DNX12 (GAUZE/BANDAGES/DRESSINGS) ×2 IMPLANT
DRSG OPSITE POSTOP 4X10 (GAUZE/BANDAGES/DRESSINGS) ×2 IMPLANT
ELECT REM PT RETURN 9FT ADLT (ELECTROSURGICAL) ×2
ELECTRODE REM PT RTRN 9FT ADLT (ELECTROSURGICAL) ×1 IMPLANT
EXTRACTOR VACUUM BELL STYLE (SUCTIONS) IMPLANT
GLOVE BIOGEL PI IND STRL 7.0 (GLOVE) ×1 IMPLANT
GLOVE BIOGEL PI IND STRL 8 (GLOVE) ×1 IMPLANT
GLOVE BIOGEL PI INDICATOR 7.0 (GLOVE) ×1
GLOVE BIOGEL PI INDICATOR 8 (GLOVE) ×1
GLOVE ECLIPSE 8.0 STRL XLNG CF (GLOVE) ×2 IMPLANT
GOWN STRL REUS W/TWL LRG LVL3 (GOWN DISPOSABLE) ×4 IMPLANT
KIT ABG SYR 3ML LUER SLIP (SYRINGE) ×2 IMPLANT
NDL HYPO 18GX1.5 BLUNT FILL (NEEDLE) ×1 IMPLANT
NDL HYPO 25X5/8 SAFETYGLIDE (NEEDLE) ×1 IMPLANT
NEEDLE HYPO 18GX1.5 BLUNT FILL (NEEDLE) ×2 IMPLANT
NEEDLE HYPO 22GX1.5 SAFETY (NEEDLE) ×2 IMPLANT
NEEDLE HYPO 25X5/8 SAFETYGLIDE (NEEDLE) ×2 IMPLANT
NS IRRIG 1000ML POUR BTL (IV SOLUTION) ×2 IMPLANT
PACK C SECTION WH (CUSTOM PROCEDURE TRAY) ×2 IMPLANT
PAD OB MATERNITY 4.3X12.25 (PERSONAL CARE ITEMS) ×2 IMPLANT
PENCIL SMOKE EVAC W/HOLSTER (ELECTROSURGICAL) ×2 IMPLANT
RTRCTR C-SECT PINK 25CM LRG (MISCELLANEOUS) IMPLANT
STRIP CLOSURE SKIN 1/2X4 (GAUZE/BANDAGES/DRESSINGS) ×1 IMPLANT
SUT CHROMIC 0 CT 1 (SUTURE) ×2 IMPLANT
SUT MNCRL 0 VIOLET CTX 36 (SUTURE) ×2 IMPLANT
SUT MONOCRYL 0 CTX 36 (SUTURE) ×4
SUT PLAIN 2 0 (SUTURE)
SUT PLAIN 2 0 XLH (SUTURE) IMPLANT
SUT PLAIN ABS 2-0 CT1 27XMFL (SUTURE) IMPLANT
SUT VIC AB 0 CTX 36 (SUTURE) ×2
SUT VIC AB 0 CTX36XBRD ANBCTRL (SUTURE) ×1 IMPLANT
SUT VIC AB 4-0 KS 27 (SUTURE) IMPLANT
SYR 20CC LL (SYRINGE) ×4 IMPLANT
TOWEL OR 17X24 6PK STRL BLUE (TOWEL DISPOSABLE) ×2 IMPLANT
TRAY FOLEY W/BAG SLVR 14FR LF (SET/KITS/TRAYS/PACK) IMPLANT
WATER STERILE IRR 1000ML POUR (IV SOLUTION) ×2 IMPLANT

## 2019-07-23 NOTE — H&P (Signed)
OB ADMISSION/ HISTORY & PHYSICAL:  Admission Date: 07/23/2019  6:56 AM  Admit Diagnosis: Placental abruption affecting delivery [O45.90]    Peggy Becker is a 24 y.o. female G16P0010, IUP twins [name] presents to MAU with chief complaint of abdominal pain and passage of multiple large clots. Her abdominal pain began around 0230 and her heavy vaginal bleeding began around 0530. Patient had called [facility] afterhour line and left message at [phone] of heavy bleeding and on her way to [facility], no phone number or DOB given.  Multiple large clots expelling with clothing removal in MAU, active bleeding noted. Dr. Cletis Media notified and en route. BS sono showed fetal brady for twin B and code cesarean was called by Dr. Elonda Husky, Faculty Practice on call OB present at [facility].  Patient moved to OR suite where she underwent an emergency cesarean section for non reassuring fetal heart rate and suspect abruption, confirmed abruption of Twin B placenta. Dr. Cletis Media assumed care of patient after uterine closure and completed cesarean section.    Patient H&P completed postpartum in PACU  Prenatal History: G2P0010   EDC : 09/12/2019, by Last Menstrual Period  Prenatal care at [facility] since 9 wks.   Prenatal course:  - [name] spontaneous twin IUP  - growth lag noted at 24 wks, diagnosed IUGR x 2 at 27 wks with normal UA Doppler flow x 2, referral to MFM and followed with serial growth and weekly Doppler. MFM growth sono 32.1 wks - Twin A EFW <1 %tile, UA doppler elevated, no absent/reverse EDF, BPP 8/8;Twin B EFW 16%tile (discordance 22%), normal UA Dopplers, BPP 8/8. BMJ course was recommended for following week.   - spotting noted at 27 wks  - recurrent vaginosis treated for BV and yeast  - single parent with good family support, FOB not involved  Prenatal Labs: ABO, Rh:   B pos Antibody: NEG (03/09 0708) Rubella:   imm RPR:   neg HBsAg:   neg HIV:   neg Hgb electrophoresis: AA GBS:   unknown 1 hr Glucola : 117 Genetic  Screening: AFP normal Ultrasound: normal female anatomy x 2, see above for growth    Maternal Diabetes: No Genetic Screening: Normal Maternal Ultrasounds/Referrals: IUGR Fetal Ultrasounds or other Referrals:  Referred to Materal Fetal Medicine  Maternal Substance Abuse:  No Significant Maternal Medications:  None Significant Maternal Lab Results:  None Other Comments:  None  Medical / Surgical History :  Past medical history:  Past Medical History:  Diagnosis Date  . Medical history non-contributory      Past surgical history:  Past Surgical History:  Procedure Laterality Date  . NO PAST SURGERIES       Family History: No family history on file.   Social History:  reports that she has never smoked. She has quit using smokeless tobacco. She reports that she does not drink alcohol or use drugs.   Allergies: Patient has no known allergies.   Current Medications at time of admission:  Medications Prior to Admission  Medication Sig Dispense Refill Last Dose  . Ferrous Sulfate (IRON PO) Take by mouth.     . Pantoprazole Sodium (PROTONIX PO) Take by mouth.     . Prenatal Vit w/Fe-Methylfol-FA (PNV PO) Take by mouth.     . terconazole (TERAZOL 7) 0.4 % vaginal cream Place 1 applicator vaginally at bedtime.        Review of Systems: ROS Sore and tired, pain at incision but coping with IV medication. Sleepy from  pain medication and general anesthesia.  Physical Exam: Vital signs and nursing notes reviewed.   General: AAO x 3, NAD Heart: RRR Lungs:CTAB Abdomen: soft, NT, LTCS incision with pressure dressing, clean/intact GU: FF at U, small lochia, foley cath to gravity, clear yellow urine Extremities: no edema   Labs:     Recent Labs    07/23/19 0821  WBC 12.1*  HGB 11.7*  HCT 36.7  PLT 197       Assessment/Plan:  23 y.o. G2P0010 at [redacted]w[redacted]d, Twin [name] IUGR x 2 S/P emergency cesarean section for non-reassuring fetal heart tones, Twin B abruption  Stable  post-op in PACU Baby girls both stable, NICU admit for prematurity  Admit to [facility], routine post-op care  Dr. Estanislado Pandy attending and updated w/ POC    Neta Mends CNM, MSN 07/23/2019, 11:19 AM

## 2019-07-23 NOTE — Anesthesia Procedure Notes (Signed)
Procedure Name: Intubation Date/Time: 07/23/2019 7:16 AM Performed by: Elgie Congo, CRNA Pre-anesthesia Checklist: Patient identified, Emergency Drugs available, Suction available and Patient being monitored Patient Re-evaluated:Patient Re-evaluated prior to induction Oxygen Delivery Method: Circle system utilized Preoxygenation: Pre-oxygenation with 100% oxygen Induction Type: IV induction, Rapid sequence and Cricoid Pressure applied Laryngoscope Size: Glidescope Grade View: Grade I Tube type: Oral Tube size: 7.0 mm Number of attempts: 1 Airway Equipment and Method: Rigid stylet Placement Confirmation: ETT inserted through vocal cords under direct vision,  positive ETCO2 and breath sounds checked- equal and bilateral Secured at: 21 cm Tube secured with: Tape Dental Injury: Teeth and Oropharynx as per pre-operative assessment

## 2019-07-23 NOTE — Transfer of Care (Signed)
Immediate Anesthesia Transfer of Care Note  Patient: Peggy Becker  Procedure(s) Performed: CESAREAN SECTION (N/A )  Patient Location: PACU  Anesthesia Type:General  Level of Consciousness: awake, alert  and oriented  Airway & Oxygen Therapy: Patient Spontanous Breathing and Patient connected to face mask oxygen  Post-op Assessment: Report given to RN and Post -op Vital signs reviewed and stable  Post vital signs: Reviewed and stable  Last Vitals:  Vitals Value Taken Time  BP 127/82 07/23/19 0815  Temp    Pulse 76 07/23/19 0820  Resp    SpO2 100 % 07/23/19 0820  Vitals shown include unvalidated device data.  Last Pain: There were no vitals filed for this visit.       Complications: No apparent anesthesia complications

## 2019-07-23 NOTE — MAU Provider Note (Signed)
First Provider Initiated Contact with Patient 07/23/19 [phone]   S [name] is a 24 y.o. G2P0010 presents to MAU with chief complaint of abdominal pain and passage of multiple large clots. Her abdominal pain began around 0230 and her heavy vaginal bleeding began around 0530.  She receives prenatal care with [facility].  O LMP 12/06/2018   Physical Exam  Nursing note and vitals reviewed. Constitutional: She appears well-developed and well-nourished.  Respiratory: Effort normal.  GI:  Gravid  Genitourinary:    Genitourinary Comments: 10-12 large clots passed once patient's pants were removed   Skin: Skin is warm and dry.  Psychiatric: She has a normal mood and affect. Her behavior is normal. Judgment and thought content normal.    A Patient met in waiting room. Blood actively running down patient's legs and over side of wheelchair Patient assisted to room, immediate passage of multiple large clots with removal of pants in exam room Verbal orders for stat IV fluid bolus, BSUS, formal bedside, Dr Cletis Media paged, Dr. Elonda Husky called and inbound to bedside FHT x 2 confirmed with formal limited BSUS as pre-op labs were collected D. Eddie Dibbles, CNM at bedside shortly thereafter   P Code cesarean initiated by Dr. Elonda Husky  Patient moved emergently to [place]. Jeronimo Greaves, [place] 07/23/2019 7:36 AM

## 2019-07-23 NOTE — Progress Notes (Signed)
Pt to OR via stretcher for stat Cesarean

## 2019-07-23 NOTE — Progress Notes (Signed)
U/S called for stat bedside ultrasound.

## 2019-07-23 NOTE — Progress Notes (Signed)
Pt arrived to MAU via wheelchair with c/o VB, taken into room 130 for evaluation.  Knox Saliva, CNM @ bedside upon pt arrival.  Pt assisted into bed, large amount bright red VB with clots noted.  Pt reports bleeding began @ 0530.

## 2019-07-23 NOTE — Op Note (Signed)
Preoperative diagnosis: Diamniotic dichorionic twin  pregnancy at 32 weeks and 5 days, placental abruption of twin B, IUGR x2  Post operative diagnosis: Same  Anesthesia:General  Anesthesiologist: Dr. Armond Hang  Procedure: Primary low transverse cesarean section  Surgeon: Dr. Jorge Mandril Karan Inclan  Assistant: Arlan Organ CNM  Estimated blood loss: 1500 cc  Procedure:  I scrubbed Dr Despina Hidden out after double closure of the myometrium (see separate Op Note)  Hemostasis is completed with cauterization on peritoneal edges.  Both paracolic gutters are cleaned. Both tubes and ovaries are assessed and normal. The pelvis is profusely irrigated with warm saline to evacuate a large amount of blood and clots from the abdominal cavity.We again confirm a satisfactory hemostasis.  Retractors and sponges are removed. Under fascia hemostasis is completed with cauterization. The fascia is then closed with 2 running sutures of 0 Vicryl meeting midline. The wound is irrigated with warm saline and hemostasis is completed with cauterization. The skin is closed with a subcuticular suture of 3-0 Monocryl and Steri-Strips.  Instrument and sponge count is complete x2. Estimated blood loss is 1500 cc.  The procedure is well tolerated by the patient who is taken to recovery room in a well and stable condition.  Female baby A was born at 7:17 and received an Apgar of 6  at 1 minute and 9 at 5 minutes. Now stable in NICU on 21% O2 Female baby B was born at 7:17 and received an Apgar of 1 at 1 minute, 6 at 5 minutes and 8 at 10 minutes. Now stable in NICU with CPAP   Specimen: Placenta x 2  sent to pathology   Crist Fat Maurio Baize MD 3/9/20218:05 AM

## 2019-07-23 NOTE — Anesthesia Preprocedure Evaluation (Signed)
Anesthesia Evaluation  Patient identified by MRN, date of birth, ID band Patient awake    Reviewed: Allergy & Precautions, NPO status , Patient's Chart, lab work & pertinent test resultsPreop documentation limited or incomplete due to emergent nature of procedure.  Airway Mallampati: II  TM Distance: >3 FB Neck ROM: Full    Dental no notable dental hx.    Pulmonary neg pulmonary ROS,    Pulmonary exam normal breath sounds clear to auscultation       Cardiovascular negative cardio ROS Normal cardiovascular exam Rhythm:Regular Rate:Normal     Neuro/Psych negative neurological ROS  negative psych ROS   GI/Hepatic negative GI ROS, Neg liver ROS,   Endo/Other  negative endocrine ROS  Renal/GU negative Renal ROS  negative genitourinary   Musculoskeletal negative musculoskeletal ROS (+)   Abdominal   Peds  Hematology negative hematology ROS (+)   Anesthesia Other Findings 32.[redacted] weeks gestation with twins, emergency cesarean for abruption twin B  Reproductive/Obstetrics (+) Pregnancy                             Anesthesia Physical Anesthesia Plan  ASA: II and emergent  Anesthesia Plan: General   Post-op Pain Management:    Induction: Rapid sequence  PONV Risk Score and Plan: 3 and Midazolam, Dexamethasone and Ondansetron  Airway Management Planned: Oral ETT  Additional Equipment:   Intra-op Plan:   Post-operative Plan: Extubation in OR  Informed Consent: I have reviewed the patients History and Physical, chart, labs and discussed the procedure including the risks, benefits and alternatives for the proposed anesthesia with the patient or authorized representative who has indicated his/her understanding and acceptance.     Dental advisory given  Plan Discussed with: CRNA  Anesthesia Plan Comments:         Anesthesia Quick Evaluation

## 2019-07-23 NOTE — Progress Notes (Signed)
Attempting to apply EFM, Twin A FHR audible @ 123bpm.  RN unable to hear Twin B FHR, but searching with U/S transducer until bedside U/S performed.

## 2019-07-23 NOTE — Anesthesia Postprocedure Evaluation (Signed)
Anesthesia Post Note  Patient: Peggy Becker  Procedure(s) Performed: CESAREAN SECTION (N/A )     Patient location during evaluation: PACU Anesthesia Type: General Level of consciousness: awake and alert Pain management: pain level controlled Vital Signs Assessment: post-procedure vital signs reviewed and stable Respiratory status: spontaneous breathing, nonlabored ventilation, respiratory function stable and patient connected to nasal cannula oxygen Cardiovascular status: blood pressure returned to baseline and stable Postop Assessment: no apparent nausea or vomiting Anesthetic complications: no    Last Vitals:  Vitals:   07/23/19 0945 07/23/19 1108  BP: (!) 122/91 117/87  Pulse: 73 69  Resp: 15   Temp: 36.4 C   SpO2: 99%     Last Pain:  Vitals:   07/23/19 1130  TempSrc:   PainSc: 6    Pain Goal:                   Redell Bhandari L Nita Whitmire

## 2019-07-23 NOTE — Op Note (Signed)
Preoperative diagnosis:  1.  Intrauterine pregnancy at [redacted]w[redacted]d  weeks gestation                                         2.  Diamniotic dichorionic twins                                         3.  Fetal growth restriction of both twins                                         4.  Placental abruption                                         6.  Fetal bradycardia twin B   Postoperative diagnosis:  Same as above plus placental abruption of twin B  Procedure: Low transverse primary cesarean section, STAT, double layered uterine closure  Surgeon:  Lazaro Arms MD  Assistant:  Crissie Reese, MD  Anesthesia: GET  Findings:  Peggy Becker presented to the maternity admissions unit with bleeding which began around 5 AM was preceded by abdominal pain which began around 2 AM.  It appears that the patient arrived in the room at 06 55 and I was called very shortly thereafter.  I arrived and they still have not been able to get the fetal heart rate of twin B but the ultrasonographer was able to find it the bedside ultrasound and it was 78 bpm.  My impression is that this represented a significant placental abruption likely involving twin B and I called for a stat cesarean section and a code cesarean.  The patient is a patient of [facility] and Dr. Estanislado Pandy is on-call and has been called regarding her presentation to the MAU.  I called the code cesarean at 06 59 patient was transferred up to labor and delivery immediately and preparations were made for an emergency cesarean section.  It appears that I made the incision in 0 716 and I delivered twin A at 0 717.  Twin B was also delivered at 0 717.  There was an obvious abruption of the placenta of twin B and there was evidence of blood resorption into the myometrium the uterus.  Would not describe it as a true [place] uterus but certainly had a large anterior oozing pattern consistent with absorption of blood clot.  The neonatology team was in place and both  babies were handed off for immediate resuscitation.  Cord gases were sent and both of the twins  Description of operation:  As stated above patient was taken to the operating room suite in a stat fashion for a code cesarean delivery Patient underwent a splash prep placement of Foley catheter Simultaneously anesthesia was making preparations for general endotracheal anesthesia Patient was draped When the endotracheal tube was confirmed proper location I made a Pfannenstiel skin incision 0 716 The fascia was opened manually and the rectus muscles were divided manually Peritoneal cavity was entered manually Bladder blade was placed A low transverse hysterotomy incision was made Over this incision was delivered twin A at  0 717, twin A was assigned Apgars of 6 and 9 by the neonatology team.  Cord gases pending.  Fetal weight is 2 pounds 13.5 ounces or 1290 g.  Twin A was a female Twin B was also delivered at 0 717.  As of the time of this dictation Apgars had not been assigned by the neonatology team for twin B.  Twin B so a female.  As of the time of this dictation twin B's weight had not been yet performed.  Twin B also had a cord gas that was pending at the time of this dictation.  The placentas were basically already delivered at this point and again it was confirmed that twin B's placenta had a retroplacental clot and was consistent with  an abruption.  Really could not give an estimation of the percent but it appeared to be significant The uterus was a little bit hypertonic and Methergine 0.2 mg IM was ordered given with good return of uterine tone The uterus and the I had extravasated blood in the myometrium consistent with an abruption The uterus tubes and ovaries were otherwise normal The uterus was closed in 2 layers The first layer was a running interlocking layer using 0 Monocryl Second layer was a running interlocking imbricating layer using 0 Monocryl with good return of anatomy At  this point Dr. Cletis Media had arrived and was scrubbed and was prepared to take over the remainder of this case. This point I handed the case over to Dr. Cletis Media for completion. I exited the OR suite at this point.    Florian Buff 07/23/2019 7:34 AM

## 2019-07-24 LAB — CBC
HCT: 20.6 % — ABNORMAL LOW (ref 36.0–46.0)
HCT: 28.8 % — ABNORMAL LOW (ref 36.0–46.0)
Hemoglobin: 6.7 g/dL — CL (ref 12.0–15.0)
Hemoglobin: 9.7 g/dL — ABNORMAL LOW (ref 12.0–15.0)
MCH: 30 pg (ref 26.0–34.0)
MCH: 30.5 pg (ref 26.0–34.0)
MCHC: 32.5 g/dL (ref 30.0–36.0)
MCHC: 33.7 g/dL (ref 30.0–36.0)
MCV: 92.4 fL (ref 80.0–100.0)
MCV: 90.6 fL (ref 80.0–100.0)
Platelets: 174 10*3/uL (ref 150–400)
Platelets: 180 10*3/uL (ref 150–400)
RBC: 2.23 MIL/uL — ABNORMAL LOW (ref 3.87–5.11)
RBC: 3.18 MIL/uL — ABNORMAL LOW (ref 3.87–5.11)
RDW: 16.1 % — ABNORMAL HIGH (ref 11.5–15.5)
RDW: 16 % — ABNORMAL HIGH (ref 11.5–15.5)
WBC: 14.2 10*3/uL — ABNORMAL HIGH (ref 4.0–10.5)
WBC: 12.3 10*3/uL — ABNORMAL HIGH (ref 4.0–10.5)
nRBC: 0.4 % — ABNORMAL HIGH (ref 0.0–0.2)
nRBC: 0.4 % — ABNORMAL HIGH (ref 0.0–0.2)

## 2019-07-24 LAB — SURGICAL PATHOLOGY

## 2019-07-24 LAB — PREPARE RBC (CROSSMATCH)

## 2019-07-24 MED ORDER — SODIUM CHLORIDE 0.9% IV SOLUTION: Freq: Once | INTRAVENOUS | Status: DC

## 2019-07-24 MED ORDER — DIPHENHYDRAMINE HCL 25 MG PO CAPS
25.0000 mg | ORAL_CAPSULE | Freq: Once | ORAL | Status: AC
  Administered 2019-07-24: 25 mg via ORAL
  Filled 2019-07-24: qty 1

## 2019-07-24 MED ORDER — ACETAMINOPHEN 325 MG PO TABS
650.0000 mg | ORAL_TABLET | Freq: Once | ORAL | Status: AC
  Administered 2019-07-24: 650 mg via ORAL
  Filled 2019-07-24: qty 2

## 2019-07-24 NOTE — Progress Notes (Addendum)
Peggy Becker [id number] Postpartum Postoperative Day # 1  Peggy Becker [place], [id number], [redacted]w[redacted]d with di/di twins, S/P emergent LT Cesarean Section due to placental abruption.    Live born female Baby A 07/23/19 @ 0717 Birth Weight: 1290g (2lb 13.5oz) Apgars: 6, 9  Live born female Baby B 07/23/19 @ 0717 Birth Weight: 1640g (3lb 9.9oz) Apgars: 1, 6, 8  Subjective: Patient up ad lib, denies syncope or dizziness. Reports consuming regular diet without issues and denies N/V. Patient reports no bowel movements but is passing flatus. Denies issues with urination and reports bleeding is minimal  Patient is pumping breastmilk and reports going well. Pain is being appropriately managed with use of oxycodone. Mother reports both babies are doing well in the NICU and she has been to see them 3 times. Pt states the father of the babies arrived yesterday from [place] and is supportive.   Objective: Vitals:   07/24/19 0422 07/24/19 0423 07/24/19 0808 07/24/19 0855  BP: (!) 105/54 Comment: nurse notified (!) 101/49 105/60 128/76  Pulse: 94 81 77 73  Temp: 98 F (36.7 C)  98.5 F (36.9 C) 98.4 F (36.9 C)  Resp: 18  18 18   Height:      Weight:      SpO2: 100%  100% 100%  TempSrc: Oral  Oral Oral  BMI (Calculated):       Physical Exam:  General: alert, cooperative, no distress and pale Mood/Affect: Appropriate Lungs: clear to auscultation, no wheezes, rales or rhonchi, symmetric air entry.  Heart: normal rate, regular rhythm, normal S1, S2, no murmurs, rubs, clicks or gallops. Breast: breasts appear normal, no suspicious masses, no skin or nipple changes or axillary nodes. Abdomen:  + bowel sounds, soft, non-tender Incision: no significant drainage, Honeycomb dressing  Uterine Fundus: firm, involution U-1 Lochia: appropriate Skin: Warm, Dry. DVT Evaluation: No evidence of DVT seen on physical exam.  Labs: Recent Labs    07/23/19 0821 07/23/19 1316 07/24/19 0620  HGB 11.7*  9.5* 6.7*  HCT 36.7 29.6* 20.6*  WBC 12.1* 15.3* 14.2*   I/O: I/O last 3 completed shifts: In: 2506.5 [I.V.:2206.5; IV Piggyback:300] Out: 4047 [Urine:2050; Blood:1997]   Assessment Postpartum Postoperative Day # 1. 11-12-1969, Peggy Becker, [redacted]w[redacted]d with di/di twins, S/P emergent LT Cesarean Section due to placental abruption.  Pt stable and asymptomatic but last hemoglobin/hematocrit was 6.7/20.6. EBL in OR was 1500cc. Will transfuse 2 units of PRBCs and obtain CBC 4 hours after the last unit. Appropriate involution at U-1 with minimal bleeding. Pt is pumping for infants in NICU.   Plan: 1. After discussing case with Dr. 06-21-1984, will order 2 units of PRBCs to be transfused and obtain CBC 4 hour after last unit finishes.  2. Continue other postpartum care as ordered 3. VTE Prophylactics: SCDs and ambulate as tolerates.  4. Pain control: Motrin/Tylenol/Narcotics PRN 5. Pt is undecided concerning postpartum contraception. Discussed options and answered all questions.   Dr. Mora Becker will be updated on patient status.  Peggy Becker, CNM, MSN 07/24/2019, 9:08 AM

## 2019-07-24 NOTE — Lactation Note (Signed)
This note was copied from a baby's chart. Lactation Consultation Note  Patient Name: Peggy Becker PJASN'K Date: 07/24/2019    Initial lactation consultation when babies were 27h old.   [facility] student entered to Mom and MGM in the room. Mom is currently receiving a blood transfusion due to blood loss as a result of Baby B placental abruption.   [facility] student gave Mom the NICU pumping brochure and asked how pumping was going. Mom reported that she got some milk with pumping yesterday (LC student observed 3 colostrum collecting vials containing 2-58ml each in the fridge), but pumped once this morning and got nothing. [facility] student took Mom through pumping expectations during the first few days, showed her page 5 in the brochure and emphasized that consistent stimulation of the breasts is important for milk production. [facility] student educated that pumping goal is 8 to 12 pumping sessions in 24 hours, or pumping every 2 to 3 hours. MGM asked if Mom could sleep, and LC confirmed after consulting with LC that the goal is 8 to 12 pumping sessions including overnight, but that if Mom needs to rest she can push pumping sessions closer together while she is awake to still achieve her pumping goal.    LC student assessed flange fit for both the Medela DEBP, and Mom's personal Lasinoh pump, the Medela 24 fit appears correct as does the fit of the personal pump. LC student recommended coconut oil use to prevent abrasions on the nipple. [facility] student requested that the nurse provide coconut oil for Mom. Mom is registered with [facility], and [facility] student informed Mom that she could get a loaner hospital grade pump from [facility] should she wish to as she is pumping for two NICU babies. Mom was enthusiastic about the [facility] pump, and after consulting with LC, LC student completed [facility] referral paperwork with Mom.  LC student educated Mom in hand expression technique, and assisted Mom to perfect her technique. Colostrum was easily  expressed. Mom was encouraged by visible colostrum. [facility] student suggested that Mom hand express before and after pumping when she feels able, to increase breast stimulation.   Mom asked about breast milk storage, and LC student confirmed the brochure guidelines that milk is fridge safe for 3 - 4 days. [facility] student emphasized that milk be kept at the back of the fridge, and let Mom know that she can take whatever milk she has to the NICU at every visit.   Mom has no further questions at this time.    [name] [name] 07/24/2019, 11:38 AM

## 2019-07-24 NOTE — Progress Notes (Signed)
Patient screened out for psychosocial assessment since none of the following apply:  Psychosocial stressors documented in mother or baby's chart  Gestation less than 32 weeks  Code at delivery   Infant with anomalies Please contact the Clinical Social Worker if specific needs arise, by MOB's request, or if MOB scores greater than 9/yes to question 10 on Edinburgh Postpartum Depression Screen.  Tangela Dolliver, LCSW Clinical Social Worker [facility] Cell#: [phone]     

## 2019-07-25 LAB — TYPE AND SCREEN
ABO/RH(D): B POS
Antibody Screen: NEGATIVE
Unit division: 0
Unit division: 0

## 2019-07-25 LAB — BPAM RBC
Blood Product Expiration Date: 202103292359
Blood Product Expiration Date: 202103302359
ISSUE DATE / TIME: 202103100856
ISSUE DATE / TIME: 202103101211
Unit Type and Rh: 7300
Unit Type and Rh: 7300

## 2019-07-25 NOTE — Lactation Note (Signed)
This note was copied from a baby's chart. Lactation Consultation Note  Patient Name: Peggy Becker PYPPJ'K Date: 07/25/2019 Reason for consult: NICU baby;Multiple gestation;Preterm <34wks;Follow-up assessment  33 weeks Twins A and B - Per mom pumped x 4 yesterday and 2 x's today.  Praised mom for her pumping in the last 24 hours and encouraged mom to  Increase pumping 8-12 times in 24 hours both breast for 15 -20 mins , also to consider Hand expressing before pumping and after.  Mom mentioned she felt hand expressing was uncomfortable and painful.  LC recommending prior to pumping - moist warm compresses , breast massage, hand express, and then pump. LC explained to mom the moist heat will help the milk ducts relax and should be more comfortable.  LC stressed the importance of consistent pumping around the clock and the importance of the 1st 2 weeks of pumping.  Mom has the colostrum storage containers and the storage bottles.    Maternal Data Has patient been taught Hand Expression?: Yes  Feeding Feeding Type: Donor Breast Milk  LATCH Score                   Interventions Interventions: Breast feeding basics reviewed  Lactation Tools Discussed/Used Tools: Pump Breast pump type: Double-Electric Breast Pump Pump Review: Milk Storage   Consult Status Consult Status: Follow-up Date: 07/26/19 Follow-up type: In-patient    Peggy Becker 07/25/2019, 5:08 PM

## 2019-07-25 NOTE — Progress Notes (Signed)
Peggy Becker [id number] Postpartum Postoperative Day # 2  Peggy Becker, [id number], [redacted]w[redacted]d with di/di twins, S/P emergent LT Cesarean Section due to placental abruption.    Live born female Baby A 07/23/19 @ 0717 Birth Weight: 1290g (2lb 13.5oz) Apgars: 6, 9  Live born female Baby B 07/23/19 @ 0717 Birth Weight: 1640g (3lb 9.9oz) Apgars: 1, 6, 8  Subjective: Patient up ad lib, denies syncope or dizziness. Reports consuming regular diet without issues and denies N/V. Patient reports no bowel movements but is passing flatus. Denies issues with urination and reports bleeding is "very light".  Patient is pumping breastmilk and reports going well. Pain is being appropriately managed with use of oxycodone and Tylenol. Pt states she "doesn't feel any different" after receiving 2 units of blood yesterday. Pt reports both babies are doing well in the NICU and she visits regularly. Pt states that support system includes the the father of the babies and her mother. Pt is unsure if she desires discharge today or tomorrow.   Objective: Vitals:   07/24/19 1605 07/24/19 1607 07/24/19 1608 07/25/19 0531  BP: (!) 111/40 (!) 118/44 (!) 118/44 109/65  Pulse: (!) 104 (!) 102 (!) 102 67  Temp:   98.3 F (36.8 C) 98.3 F (36.8 C)  Resp:   18 18  Height:      Weight:      SpO2:   100% 100%  TempSrc:    Oral  BMI (Calculated):       Physical Exam:  General: Alert and cooperative Mood/Affect: Appropriate Lungs: clear to auscultation, no wheezes, rales or rhonchi, symmetric air entry.  Heart: normal rate, regular rhythm, normal S1, S2, no murmurs, rubs, clicks or gallops. Breast: breasts appear normal, no suspicious masses, no skin or nipple changes or axillary nodes. Abdomen:  + bowel sounds, soft, non-tender Incision: no significant drainage, Honeycomb dressing  Uterine Fundus: firm, involution U-2 Lochia: appropriate Skin: Warm, Dry. DVT Evaluation: No evidence of DVT seen on physical  exam.  Labs: Recent Labs    07/23/19 1316 07/24/19 0620 07/24/19 2004  HGB 9.5* 6.7* 9.7*  HCT 29.6* 20.6* 28.8*  WBC 15.3* 14.2* 12.3*   I/O: I/O last 3 completed shifts: In: 3584.5 [P.O.:300; I.V.:2206.5; Blood:778; IV Piggyback:300] Out: 5197 [Urine:3200; Blood:1997]   Assessment Postpartum Postoperative Day # 2. Peggy Becker, [id number], [redacted]w[redacted]d with di/di twins, S/P emergent LT Cesarean Section due to placental abruption. Pt stable after receiving 2 units of PRBCs yesterday. Post transfusion hemoglobin and hematocrit are 9.7/28.8, up from 6.7/20.6. Vital signs stable. Appropriate involution at U-2 with scant to small amount of bleeding. Dressing is clean and dry. Pt is pumping breast milk for infants in NICU.   Plan: 1. Continue postpartum care as ordered 2. VTE Prophylactics: SCDs and ambulate as tolerates.  3. Pain control: Motrin/Tylenol/Narcotics PRN 4. Pt is undecided concerning postpartum contraception. Discussed options and answered all questions.  5. Will consider discharge later this afternoon or tomorrow.   Dr. Su Hilt will be updated on patient status.  Peggy Becker, CNM, MSN 07/25/2019, 6:30 AM

## 2019-07-25 NOTE — Progress Notes (Signed)
LTCS honeycomb dressing is changed & new steri strips with benzoine are applied with aseptic technique.

## 2019-07-26 ENCOUNTER — Other Ambulatory Visit (HOSPITAL_COMMUNITY): Payer: PRIVATE HEALTH INSURANCE

## 2019-07-26 ENCOUNTER — Encounter (HOSPITAL_COMMUNITY): Payer: Self-pay

## 2019-07-26 ENCOUNTER — Ambulatory Visit (HOSPITAL_COMMUNITY): Admission: RE | Admit: 2019-07-26 | Payer: PRIVATE HEALTH INSURANCE | Source: Ambulatory Visit

## 2019-07-26 ENCOUNTER — Ambulatory Visit (HOSPITAL_COMMUNITY): Payer: PRIVATE HEALTH INSURANCE

## 2019-07-26 MED ORDER — OXYCODONE HCL 5 MG PO TABS: 5.0000 mg | ORAL_TABLET | ORAL | 0 refills | Status: DC | PRN

## 2019-07-26 MED ORDER — IBUPROFEN 600 MG PO TABS: 600.0000 mg | ORAL_TABLET | Freq: Four times a day (QID) | ORAL | 0 refills | Status: DC

## 2019-07-26 NOTE — Plan of Care (Signed)
  Problem: Education: Goal: Knowledge of General Education information will improve Description: Including pain rating scale, medication(s)/side effects and non-pharmacologic comfort measures Outcome: Completed/Met

## 2019-07-26 NOTE — Progress Notes (Signed)
Discharge teaching complete with pt. Pt understood all information and did not have any questions. Pt discharged home with family.  

## 2019-07-26 NOTE — Plan of Care (Signed)
  Problem: Education: Goal: Knowledge of condition will improve Outcome: Progressing   

## 2019-07-26 NOTE — Lactation Note (Signed)
This note was copied from a baby's chart. Lactation Consultation Note  Patient Name: Peggy Becker Date: 07/26/2019 Reason for consult: Follow-up assessment;NICU baby;Multiple gestation;Infant < 6lbs;Preterm <34wks;1st time breastfeeding;Primapara;Other (Comment)(breast are full and milk coming to volume)  LC visited mom in her room . Per mom pumped x 5 yesterday and ended up sleeping through the night. Breast fuller, heavier and warmer today. Mom denies soreness.  LC assessed breast tissue with moms permission , no engorgement , just filling.  LC reviewed sore nipples and engorgement prevention and tx, storage of breast milk.  S/S of mastitis.  LC stressed the importance of pumping around the clock both breast for 15 -20 mins And add hand expressing before or afterwards.  If the #24 F is to snug when breast are full increase to the #27 F. Per mom has been in touch with [facility] [place] and will be picking up a DEBP today.  Mom has the NICu booklet and is aware to record pumpings.  Mom aware when she is visiting the Twins she can have the NICU RN call for LC .      Maternal Data Has patient been taught Hand Expression?: Yes  Feeding Feeding Type: Donor Breast Milk  LATCH Score                   Interventions Interventions: Breast feeding basics reviewed  Lactation Tools Discussed/Used Tools: Pump;Flanges Flange Size: 27;24 Breast pump type: Double-Electric Breast Pump WIC Program: Yes(per mom will be picking up the DEBP from [facility] today) Pump Review: Milk Storage;Setup, frequency, and cleaning(explained the Maintenace mode on the DEBP) Initiated by:: MAI   Consult Status Consult Status: PRN Date: (in NICU - Twins) Follow-up type: In-patient    Peggy Becker 07/26/2019, 9:46 AM

## 2019-07-26 NOTE — Discharge Summary (Signed)
Postpartum Discharge Summary      Patient Name: Peggy Becker DOB: Sep 21, 1995 MRN: [id number]  Date of admission: 07/23/2019 Postpartum Postoperative Day # 3 [id number], [redacted]w[redacted]d with di/di twins, S/P emergent LT Cesarean Section due to placental abruption.    Live born female Baby A 07/23/19 @ 0717 Birth Weight: 1290g (2lb 13.5oz) Apgars: 6, 9  Live born female 51 B 07/23/19 @ 0717 Birth Weight: 1640g (3lb 9.9oz) Apgars: 1, 6, 8  Date of discharge: 07/26/2019  Admitting diagnosis: Placental abruption affecting delivery [O45.90] Intrauterine pregnancy: [redacted]w[redacted]d     Secondary diagnosis:  Principal Problem:   Postpartum care following cesarean delivery - 3/9 Active Problems:   Twin gestation in third trimester - [name]   Delivery by emergency cesarean 3/9 - abruption TwinB 32.5 wks   Placental abruption affecting delivery  Discharge diagnosis: Preterm Pregnancy Delivered                                                                                                Post partum procedures:blood transfusion  Complications: Placental Abruption and Hemorrhage>106mL  The patient went for cesarean section due to Placental abruption with twin gestation, and delivered a Viable infant,   Etheleen, Valtierra [[id number]]  07/23/2019    Kassady, Laboy [[id number]]  07/23/2019   Details of operation can be found in separate operative note. Patient had 2UPRBC hgb 6.7-9.7.  She is ambulating,tolerating a regular diet, passing flatus, and urinating well.  Patient is discharged home in stable condition 07/26/19. Delivery time:    Ladaja, Yusupov [[id number]]  7:17 AM    Yasira, Engelson [[id number]]  7:17 AM   Transfusion:Yes  Physical exam  Vitals:   07/25/19 1651 07/26/19 0032 07/26/19 0534 07/26/19 0535  BP:  124/74 112/65   Pulse:  75 77   Resp: 18 17 17    Temp: 98.2 F (36.8 C) 98.1 F (36.7 C) 98 F (36.7 C)   TempSrc: Oral Oral    SpO2: 100% 100% 99% 98%  Weight:       Height:       General: alert, cooperative and no distress Lochia: appropriate Uterine Fundus: firm Incision: Dressing is clean, dry, and intact DVT Evaluation: No evidence of DVT seen on physical exam. Negative Homan's sign. No cords or calf tenderness. Labs: Lab Results  Component Value Date   WBC 12.3 (H) 07/24/2019   HGB 9.7 (L) 07/24/2019   HCT 28.8 (L) 07/24/2019   MCV 90.6 07/24/2019   PLT 180 07/24/2019   No flowsheet data found. Edinburgh Score: Edinburgh Postnatal Depression Scale Screening Tool 07/24/2019  I have been able to laugh and see the funny side of things. 0  I have looked forward with enjoyment to things. 0  I have blamed myself unnecessarily when things went wrong. 0  I have been anxious or worried for no good reason. 0  I have felt scared or panicky for no good reason. 0  Things have been getting on top of me. 1  I have been so unhappy that I have had difficulty sleeping. 0  I have  felt sad or miserable. 0  I have been so unhappy that I have been crying. 0  The thought of harming myself has occurred to me. 0  Edinburgh Postnatal Depression Scale Total 1    Discharge instruction: per After Visit Summary and "Baby and Me Booklet".  After visit meds:  Allergies as of 07/26/2019   No Known Allergies     Medication List    STOP taking these medications   ferrous sulfate 325 (65 FE) MG tablet   pantoprazole 40 MG tablet Commonly known as: PROTONIX   prenatal multivitamin Tabs tablet     TAKE these medications   ibuprofen 600 MG tablet Commonly known as: ADVIL Take 1 tablet (600 mg total) by mouth every 6 (six) hours.   oxyCODONE 5 MG immediate release tablet Commonly known as: Oxy IR/ROXICODONE Take 1-2 tablets (5-10 mg total) by mouth every 4 (four) hours as needed for moderate pain.       Diet: routine diet  Activity: Advance as tolerated. Pelvic rest for 6 weeks.   Outpatient follow up:6 weeks Follow up Appt:No future  appointments. Follow up Visit: Follow-up Information    [facility] [place] Follow up in 6 week(s).   Specialty: Obstetrics and Gynecology Contact information: [address] [place] [address] [phone]           Newborn Data:   Anaalicia, Reimann [[id number]]  Live born female  Birth Weight: 2 lb 13.5 oz (1290 g) APGAR: 6, 9  Newborn Delivery   Birth date/time: 07/23/2019 07:17:00 Delivery type: C-Section, Low Transverse Trial of labor: No C-section categorization: Primary       Bronwyn, Belasco [[id number]]  Live born female  Birth Weight: 3 lb 9.9 oz (1640 g) APGAR: 1, 6  Newborn Delivery   Birth date/time: 07/23/2019 07:17:00 Delivery type: C-Section, Low Transverse Trial of labor: No C-section categorization: Primary      Baby Feeding: Bottle and Breast Disposition:NICU   07/26/2019 Ike Bene, CNM

## 2019-07-26 NOTE — Progress Notes (Signed)
Pt pointed out "hard spot" on her right lateral breast.  She said that she noticed it in pregnancy and during her 28 week appointment in the office, she was told it was a blood vessel.  With permission, RN palpated area.  Superficial, pea-sized, firm nodule felt in RUQ of breast.  Pt denies pain with palpation of nodule, despite both breasts being tender due to filling with breast milk.  Phoned Verdis Prime, CNM with above information.  CNM said that likely this nodule is a milk duct and that it can be examined at pt's post-partum check in the office.  CNM said that diagnostic imaging can be ordered at that time if there are any concerns.

## 2019-07-27 ENCOUNTER — Ambulatory Visit: Payer: Self-pay

## 2019-07-27 NOTE — Lactation Note (Signed)
This note was copied from a baby's chart. Lactation Consultation Note  Patient Name: Peggy Becker Today's Date: 07/27/2019   I attempted to follow up with [name]. She was not in the room. Her RN states that she was having a virtual baby shower today.  As per [facility] comment from 3/12, [name] has been encouraged to increase pumping from 5 times a day to 8 times a day. The RN states that [name] has commented an improvement of milk volume, but she states that [name] could use further education on how much milk she will need to pump to support twins, particularly as they wean from donor breast milk and grow.  I stated that lactation could follow up on 3/14 to check in with [name] to see if she has been able to increase her frequency and to see how that's going.   Consult Status Consult Status: Follow-up Date: 07/28/19 Follow-up type: In-patient    Walker Shadow 07/27/2019, 4:28 PM

## 2019-07-30 ENCOUNTER — Ambulatory Visit: Payer: Self-pay

## 2019-07-30 NOTE — Lactation Note (Addendum)
This note was copied from a baby's chart. Lactation Consultation Note:  Mother is a P2 with Twins 33+5 weeks. Infants are 52 days old. LC apt was scheduled for Baby A to nuzzle and latch if possible.  Mother doing STS when I arrived to the room. Infant very fussy. Appeared hungry. Mother reports that infant is always fussy when she places her STS.  Mothers breast very full. Unable to latch infant. Mother reports that she last pumped 4 hours ago.  Mother was given a hand pump and pre- pump for several mins.  Infant was able to latch for a few sucks but no sustained latch was achieved.  Infant had a wet diaper and then became calm after changing diaper. Encouraged mother to pump every 2-3 hours instead of 3-4 hours. Mother is pumping approx 120 ml with each pumping.  Mother is using Symphony at the hospital and she has a Holiday representative from [facility] at home for use.   Mother to continue to due STS. Mother is aware of available LC services at [facility], BFSG'S, OP Dept, and phone # for questions or concerns about breastfeeding.  Mother receptive to all teaching and plan of care.   Mother will schedule another LC visit with Baby A to attempt a latch .    Patient Name: Peggy Becker Today's Date: 07/30/2019 Reason for consult: Follow-up assessment   Maternal Data Has patient been taught Hand Expression?: Yes  Feeding Feeding Type: (nuzzled with a few sucks, no sustained latch)  LATCH Score                   Interventions Interventions: Hand express;Pre-pump if needed;Adjust position;Support pillows;Position options;Expressed milk;Hand pump  Lactation Tools Discussed/Used Pump Review: Setup, frequency, and cleaning;Milk Storage Initiated by:: Stevan Born RN, IBCLC Date initiated:: 07/23/19   Consult Status Consult Status: Follow-up Date: 07/31/19 Follow-up type: In-patient    Stevan Born [facility] 07/30/2019, 11:43 AM

## 2019-08-02 ENCOUNTER — Encounter (HOSPITAL_COMMUNITY): Payer: Self-pay

## 2019-08-02 ENCOUNTER — Ambulatory Visit (HOSPITAL_COMMUNITY): Payer: PRIVATE HEALTH INSURANCE

## 2019-08-05 ENCOUNTER — Ambulatory Visit: Payer: Self-pay

## 2019-08-05 NOTE — Lactation Note (Signed)
This note was copied from a baby's chart. Lactation Consultation Note  Patient Name: Peggy Becker Today's Date: 08/05/2019 Reason for consult: Follow-up assessment;Primapara;1st time breastfeeding;NICU baby;Infant < 6lbs;Multiple gestation  LC in to see first time Mom of twins at 109 days old.  Babies are AGA [redacted]w[redacted]d.    Mom arrived with several bottles of EBM.  Mom had a couple days where she wasn't feeling well.  C/O headache and sore breasts.  Mom states left breast was tender and reddened on the outside of breast.  Mom did warm compresses and massage prior to pumping, and increased her pumping to every 2 hrs, except for 5 hrs at night.  Mom is expressing 4 oz per pumping currently.    On assessment, both breasts are full, not engorged.  Slight reddened area on both breasts surrounding the areola.  Mom denies fever or flu-like symptoms currently.  Reassured Mom that breasts look normal right now.  Encouraged continued warm compresses and massage prior to pumping.  Lots of STS prior to pumping.  Recommended she wake up sooner to pump allowing for one 4 hr stretch at night.   S/S of plugged duct reviewed.  Explained how well that Mom handled her suspected plugged duct.  If she is prone and feels these symptoms worsening, talked about Lecithin added to her diet can help prevent plugged ducts.   Mom using a Symphony DEBP at home.    Praised Mom for her commitment to providing breastmilk to her babies.  Mom aware of lactation support available to her and encouraged to ask for LC prn.   Interventions Interventions: Breast feeding basics reviewed;Skin to skin;Breast massage;Hand express;DEBP;Expressed milk  Lactation Tools Discussed/Used Tools: Pump Breast pump type: Double-Electric Breast Pump   Consult Status Consult Status: Follow-up Date: 08/12/19 Follow-up type: In-patient    Judee Clara 08/05/2019, 5:54 PM

## 2019-08-08 ENCOUNTER — Ambulatory Visit: Payer: Self-pay

## 2019-08-08 NOTE — Lactation Note (Signed)
This note was copied from a baby's chart. Lactation Consultation Note  Patient Name: Peggy Becker LSLHT'D Date: 08/08/2019 Reason for consult: NICU baby;Multiple gestation;Late-preterm 34-36.6wks Babies are 2 weeks in the NICU(35 weeks PMA)  Mom has been pumping every 2 hours with symphony pump but milk supply is decreasing.  Mom initially was obtaining 60-70 mls but recently obtaining 20 mls. She continues to have a little discomfort in outer area of left breast.  Breast is soft and no redness noted.  Mom was told to pump prior to breastfeeding attempt so she just finished pumping.  Baby B has been latching fairly well per mom.  Reviewed preterm feeding expectations.  Baby B positioned in football hold on right breast.  Mom hand expressed a small amount of milk into baby's mouth.  Mom's nipples are short.  Baby attempted to latch but unable to grasp tissue.  20 mm nipple shield applied.  Baby latched well and sucked off and on for 10 minutes.  No swallows noted.  Discussed post pumping next time instead of prior to feed.  I feel baby will handle flow well since supply is low.  Mom praised for all her efforts.  Report given to RN.  Maternal Data    Feeding Feeding Type: Donor Breast Milk  LATCH Score Latch: Repeated attempts needed to sustain latch, nipple held in mouth throughout feeding, stimulation needed to elicit sucking reflex.  Audible Swallowing: None  Type of Nipple: Everted at rest and after stimulation  Comfort (Breast/Nipple): Soft / non-tender  Hold (Positioning): Assistance needed to correctly position infant at breast and maintain latch.  LATCH Score: 6  Interventions Interventions: Hand express;Position options;Assisted with latch  Lactation Tools Discussed/Used Tools: Nipple Shields Nipple shield size: 20   Consult Status Consult Status: PRN Follow-up type: Call as needed    Huston Foley 08/08/2019, 3:17 PM

## 2019-08-10 ENCOUNTER — Ambulatory Visit: Payer: Self-pay

## 2019-08-10 NOTE — Lactation Note (Signed)
This note was copied from a baby's chart. Lactation Consultation Note  Patient Name: Peggy Becker QMVHQ'I Date: 08/10/2019 Reason for consult: Follow-up assessment;NICU baby;Multiple gestation;Late-preterm 34-36.6wks;Infant < 6lbs  Visited with mom of 33 weeks old LPI NICU twins, mom still concerned about her supply, she's been pumping consistently every 3 hours. She told LC she was getting about 8 oz a week ago on March 20th and now she's only getting 3-4 oz. Combined at a time  Reviewed power pumping, breast massage, hydration and mom also asked about some galactagogues. She only drinks 8 cups water/day and thought it was plenty when BF twins.   Baby A  She's the smaller baby and not going to breast as much as her sister. Mom told LC she needs a smaller NS size for that baby since her mouth is also smaller (and so her nipple). LC sized her on a NS # 16 and mom reported it fit well and it wasn't pinching. Not a lot of "wiggle" room, it fit slightly snug, but not tight. Mom would like to try this NS on Monday when she has her next feeding assist with lactation.  Baby B   She just started a bottle with the help of the speech pathologist, mom is still taking her to breast, but it's more like "practicing" at the breast. She was sized with a NS # 20 for the right breast (bigger nipple) and that's the one she tried with baby "B". Mom would like to try this NS on Monday when she has her next feeding assist with lactation.  Feeding plan:  1. Encouraged mom to continue pumping every 2-3 hours during the day; without going more than 6 hours at night 2. Mom will continue taking babies STS and practicing at the breast; will use NS and call for assistance as needed 3. She'll start power pumping tomorrow morning, and will report back to lactation on Monday when she has feeding assist, NICU RN will page lactation 4. She'll try to increase water intake to 12 cups/day and will also try pumping while  having a meal/snack  Mom reported all questions and concerns were answered, she's aware of LC OP services and will call PRN.    Maternal Data    Feeding Feeding Type: Donor Breast Milk  LATCH Score Latch: Repeated attempts needed to sustain latch, nipple held in mouth throughout feeding, stimulation needed to elicit sucking reflex.  Audible Swallowing: None  Type of Nipple: Everted at rest and after stimulation  Comfort (Breast/Nipple): Soft / non-tender  Hold (Positioning): Assistance needed to correctly position infant at breast and maintain latch.  LATCH Score: 6  Interventions Interventions: Breast feeding basics reviewed;DEBP;Breast massage;Hand express  Lactation Tools Discussed/Used Tools: Pump;Nipple Shields Nipple shield size: 20 Breast pump type: Double-Electric Breast Pump   Consult Status Consult Status: Follow-up Date: 08/12/19 Follow-up type: In-patient    Araly Kaas Venetia Constable 08/10/2019, 11:01 PM

## 2019-08-12 ENCOUNTER — Ambulatory Visit: Payer: Self-pay

## 2019-08-12 NOTE — Lactation Note (Signed)
This note was copied from a baby's chart. Lactation Consultation Note  Patient Name: Peggy Becker Today's Date: 08/12/2019 Reason for consult: Follow-up assessment   LC Follow Up:  Attempted to visit with mother, however, she is asleep.      Consult Status Consult Status: PRN Date: 08/12/19 Follow-up type: Call as needed    Peggy Becker 08/12/2019, 3:54 PM

## 2019-08-22 ENCOUNTER — Ambulatory Visit: Payer: Self-pay

## 2019-08-22 NOTE — Lactation Note (Signed)
This note was copied from Becker baby's chart. Lactation Consultation Note  Patient Name: Peggy Becker Francetta Ilg Today's Date: 08/22/2019 Reason for consult: Follow-up assessment;NICU baby;Multiple gestation  I rounded on Ms. Dilger this evening. I noted that lactation had an appointment scheduled for last week that appeared to be unresolved. I asked her how she was doing and if we were supposed to see her. She stated, very kindly, that she thought we were to come last Monday, and she was not sure what happened.  Ms. Dix states that both babies are now latching to the breast. She states that [name] is latching well. Nilani takes "longer to latch" and sometimes "bites." She would like assistance with latching Nilani tomorrow (4/9) at the noon feeding. The RN asked lactation to come prior to noon because they would be initiating Becker bit early.  Ms. Canby is post pumping about 80 mls/pump (60 mls on the right and 20 on the left). She states that her supply has improved by 10-20 mls/pump since she began drinking [employer] (she heard about it from friends).  I asked her if she was pumping at night, and she stated that she sometimes breast fed at night, but there are many nights that she goes up to 8 hours without stimulation. She generally feeds/pumps during daytime hours. I explained reasoning for incorporating Becker nighttime pump and encouraged her to pick this up if she wanted to increase milk production.  I put in an [facility] consult for 4/9 upon maternal request.   Feeding Feeding Type: Breast Milk Nipple Type: Dr. Levert Feinstein [facility]   Interventions Interventions: Breast feeding basics reviewed  Lactation Tools Discussed/Used Pump Review: Setup, frequency, and cleaning   Consult Status Consult Status: Follow-up Date: 08/23/19 Follow-up type: In-patient    Walker Shadow 08/22/2019, 9:44 PM

## 2019-08-23 ENCOUNTER — Encounter (HOSPITAL_COMMUNITY): Payer: Self-pay | Admitting: Obstetrics and Gynecology

## 2019-08-23 ENCOUNTER — Ambulatory Visit: Payer: Self-pay

## 2019-08-23 NOTE — Lactation Note (Signed)
This note was copied from a baby's chart. Lactation Consultation Note  Patient Name: [name] Today's Date: 08/23/2019 Reason for consult: Follow-up assessment;1st time breastfeeding;NICU baby;Primapara;Multiple gestation;Infant < 6lbs;Preterm <34wks  LC in to assist P1 Mom of twins in the NICU.  Babies are 71 weeks old, born at [redacted]w[redacted]d, AGA [redacted]w[redacted]d.  Mom has been consistently pumping and has a full milk supply.  Mom stays in the room with the babies during the week.  She is offering the breast as often as she can, and babies are getting gavage fed if they don't breastfeed well per guidelines in the NICU.  Baby girl B went to breast STS first as she was awake and cueing.  Baby latched using cross cradle hold on right breast with 20 mm nipple shield.  Mom is adept at apply the shield and her nipple is pulled well into shield.  Baby latched on, guided Mom's hands to a U hold of her breast, and baby was able to attain a deeper latch.  Baby was rhythmic with sucks and swallows identified with each sucking burst.  Mom taught to use alternate breast compression to increase milk transfer.  Baby tolerated this well. Mom's breast was softer after baby fed for 15 mins.   Baby burped and appear contented pc.  Baby girl A needed to be awakened, unwrapped and placed STS in cross cradle hold on left breast using a 20 mm nipple shield.  Baby pursed her mouth and was resistant to latching.  After a few minutes, baby did latch and feed for 7 mins with some occasional swallows identified.  RN notified that baby would need her NG feeding.  Baby B started cueing and sucking on her thumb.  Mom latched baby B again using cross cradle hold and nipple shield.  Baby latched easily with nutritive sucking and swallowing.  Mom aware to limit baby to 30 mins max to avoid over tiring her.  Mom's milk flow is so easy and plentiful, baby is transferring milk easily currently.   Encouraged Mom to double pump after breastfeeding.   2 basins set up on counter and labeled washing and drying bins.   Mom praise for her hard work.  Mom aware of lactation support available and encouraged to ask for help prn.  Baby A has thrush and both babies are being treated currently.  Mom given information on topical nipple treatment for thrush.    Interventions Interventions: Breast feeding basics reviewed;Assisted with latch;Skin to skin;Breast massage;Hand express;Breast compression;Adjust position;Support pillows;Position options;DEBP  Lactation Tools Discussed/Used Tools: Pump;Nipple Shields Nipple shield size: 20   Consult Status Consult Status: Follow-up Date: 08/27/19 Follow-up type: In-patient    Peggy Becker 08/23/2019, 3:34 PM

## 2019-08-27 ENCOUNTER — Ambulatory Visit: Payer: Self-pay

## 2019-08-27 NOTE — Lactation Note (Signed)
This note was copied from a baby's chart. Lactation Consultation Note  Patient Name: Peggy Becker Today's Date: 08/27/2019  Mom requesting to see lactation.  Mom reports she needs another 16 mm nipple shield.  Mom reports that she is using one size for one baby and one for the other.  Mom reports using 83mm for [name] and 53mm for [name].   Mom reports the 16 mm nipple shield got misplaced. Mom reports nipples are sore from attempting to breastfeed.  Nipples reddened but intact.  Mom has condensation in tubing.  Showed mom how to get rid of tubing condensation and urged her to do it once a day and anytime she saw moisture in there. Reviewed how to apply nipple shield.  Urged her to have milk already in shield when breastfed them. Gave her another 16 mm nipple shield. Praised breastfeeding efforts and pumping.  Urged her to call lactation as needed.   Maternal Data    Feeding Feeding Type: Breast Milk Nipple Type: Dr. Levert Feinstein [facility]  [facility] Score                   Interventions    Lactation Tools Discussed/Used     Consult Status      Peggy Becker 08/27/2019, 11:13 PM

## 2019-08-27 NOTE — Lactation Note (Signed)
This note was copied from a baby's chart. Lactation Consultation Note  Patient Name: Girl A Arria Naim Today's Date: 08/27/2019 Reason for consult: Follow-up assessment;1st time breastfeeding;NICU baby;Primapara;Multiple gestation;Infant < 6lbs;Difficult latch  LC in to assist with positioning and latching baby at the breast.  Twin A was fussing and showing cues sucking vigorously on pacifier.  She pulled NG tube out of her nose.  Asked RN is we could leave NG tube out to see if she would latch to breast easier.  It was thought that she needed it replaced prior to breastfeeding.  Baby was very fussy and stiffening her lower body during and after re-insertion of NG tube.   Baby settled down and Mom positioned baby in cross cradle hold.  Mom very calm and in tune with baby's signals.  She didn't pressure baby, but would just bring her mouth gently to nipple shield.  Baby was opening her mouth, but not very widely.  Tried to entice baby with digital sucking, baby tightly sucked on finger.  Showed Mom how to stimulate baby's suck and then turn her finger over to put gentle pressure on baby's tongue.  When repeatedly trying to latch, baby's tongue was elevated and blocking the nipple.    Once baby latched to breast, Mom taught to support firmly the base of her breast in a U hold.  Baby would at times seem to relax her mouth and get deeper on the breast, but then she would tighten her latch and Mom would feel some pinching.  Nipple shield filled with milk when baby comes off.  Swallows heard when baby was rhythmically sucking.    Praised Mom for her dedication and calmness with babies.   Mom will breastfeed twin B when she is finished feeding A, and then double pump to continue to support her milk supply.  Mom stated that later this week, she heard babies may be able to breastfeed ad lib.    Mom aware of LC assist available and will ask RN to set up appt.   Feeding Feeding Type: Breast Fed  LATCH  Score Latch: Repeated attempts needed to sustain latch, nipple held in mouth throughout feeding, stimulation needed to elicit sucking reflex.  Audible Swallowing: Spontaneous and intermittent(when stimulated)  Type of Nipple: Everted at rest and after stimulation  Comfort (Breast/Nipple): Soft / non-tender  Hold (Positioning): Assistance needed to correctly position infant at breast and maintain latch.  LATCH Score: 8  Interventions Interventions: Breast feeding basics reviewed;Breast massage;Hand express;Breast compression;Adjust position;Support pillows;Position options;Assisted with latch;DEBP  Lactation Tools Discussed/Used Tools: Nipple Dorris Carnes;Pump Nipple shield size: 20 Breast pump type: Double-Electric Breast Pump   Consult Status Consult Status: Follow-up Date: 08/30/19 Follow-up type: In-patient    Judee Clara 08/27/2019, 1:29 PM

## 2019-10-12 ENCOUNTER — Ambulatory Visit
Admission: EM | Admit: 2019-10-12 | Discharge: 2019-10-12 | Disposition: A | Discharge status: Home / Self Care | Payer: No Typology Code available for payment source

## 2019-10-12 ENCOUNTER — Other Ambulatory Visit: Payer: Self-pay

## 2019-10-12 DIAGNOSIS — N61 Mastitis without abscess: Secondary | ICD-10-CM | POA: Diagnosis not present

## 2019-10-12 NOTE — Discharge Instructions (Signed)
Ibuprofen 600mg  4 times a day or 800mg  three times a day for the next 3-5 days. Warm compress. Continue to massage breast. Call OBGYN if symptoms not improving for further follow up.

## 2019-10-12 NOTE — ED Triage Notes (Signed)
Patient is here for left side breast tenderness and states she is unable to express milk from her breast since last night. She is breastfeeding.

## 2019-10-12 NOTE — ED Provider Notes (Signed)
[facility]    CSN: [id number] Arrival date & time: 10/12/19  0807      History   Chief Complaint Chief Complaint  Patient presents with  . Breast Problem    HPI Peggy Becker is a 24 y.o. female.   24 year old female comes in for left breast tenderness since last night.  She is currently breast-feeding twin 89-month-old daughters, but due to ongoing thrush for daughters, she has been pumping and bottlefeeding.  Since last night, has not been able to express breast milk from the left breast, with diffuse tenderness and warmth to the breast.  No erythema, fever.     Past Medical History:  Diagnosis Date  . Medical history non-contributory     Patient Active Problem List   Diagnosis Date Noted  . Twin gestation in third trimester - [name] 07/23/2019  . Delivery by emergency cesarean 3/9 - abruption TwinB 32.5 wks 07/23/2019  . Postpartum care following cesarean delivery - 3/9 07/23/2019  . Placental abruption affecting delivery 07/23/2019    Past Surgical History:  Procedure Laterality Date  . CESAREAN SECTION N/A 07/23/2019   Procedure: CESAREAN SECTION;  Surgeon: Lazaro Arms, MD;  Location: [facility] ORS;  Service: Obstetrics;  Laterality: N/A;  . NO PAST SURGERIES      OB History    Gravida  2   Para  1   Term      Preterm  1   AB  1   Living  2     SAB  1   TAB      Ectopic      Multiple  1   Live Births  2            Home Medications    Prior to Admission medications   Medication Sig Start Date End Date Taking? Authorizing Provider  Prenatal Vit-Fe Fum-FA-Omega (ONE-A-DAY WOMENS PRENATAL PO) Take 1 tablet by mouth daily.   Yes [provider]  ibuprofen (ADVIL) 600 MG tablet Take 1 tablet (600 mg total) by mouth every 6 (six) hours. 07/26/19   Crumpler, Lilyan Gilford, CNM    Family History History reviewed. No pertinent family history.  Social History Social History   Tobacco Use  . Smoking status: Never  Smoker  . Smokeless tobacco: Former Engineer, water Use Topics  . Alcohol use: No  . Drug use: No    Types: Marijuana     Allergies   Patient has no known allergies.   Review of Systems Review of Systems  Reason unable to perform ROS: See HPI as above.     Physical Exam Triage Vital Signs ED Triage Vitals  Enc Vitals Group     BP 10/12/19 0815 118/75     Pulse Rate 10/12/19 0815 77     Resp 10/12/19 0815 16     Temp 10/12/19 0815 98.4 F (36.9 C)     Temp Source 10/12/19 0815 Oral     SpO2 10/12/19 0815 97 %     Weight --      Height --      Head Circumference --      Peak Flow --      Pain Score 10/12/19 0819 5     Pain Loc --      Pain Edu? --      Excl. in GC? --    No data found.  Updated Vital Signs BP 118/75 (BP Location: Left Arm)   Pulse 77  Temp 98.4 F (36.9 C) (Oral)   Resp 16   SpO2 97%   Breastfeeding Yes   Physical Exam Constitutional:      General: She is not in acute distress.    Appearance: Normal appearance. She is well-developed. She is not toxic-appearing or diaphoretic.  HENT:     Head: Normocephalic and atraumatic.  Eyes:     Conjunctiva/sclera: Conjunctivae normal.     Pupils: Pupils are equal, round, and reactive to light.  Pulmonary:     Effort: Pulmonary effort is normal. No respiratory distress.     Comments: Speaking in full sentences without difficulty Chest:     Comments: Diffuse swelling/tightness to left breast. Diffuse tenderness. No masses/lesions felt. No erythema. Diffuse warmth.  Musculoskeletal:     Cervical back: Normal range of motion and neck supple.  Skin:    General: Skin is warm and dry.  Neurological:     Mental Status: She is alert and oriented to person, place, and time.      UC Treatments / Results  Labs (all labs ordered are listed, but only abnormal results are displayed) Labs Reviewed - No data to display  EKG   Radiology No results found.  Procedures Procedures (including  critical care time)  Medications Ordered in UC Medications - No data to display  Initial Impression / Assessment and Plan / UC Course  I have reviewed the triage vital signs and the nursing notes.  Pertinent labs & imaging results that were available during my care of the patient were reviewed by me and considered in my medical decision making (see chart for details).    History and exam consistent with mastitis.  At this time, without signs of bacterial infection.  Discussed will avoid antibiotics use at this time due to exam, and ongoing thrush for daughters.  Will start NSAIDs, warm compress, continue to massage and breast.  Discussed if noticing erythema, will start Keflex for bacterial infection.  Return precautions given.  Patient expresses understanding and agrees to plan.  Final Clinical Impressions(s) / UC Diagnoses   Final diagnoses:  Acute mastitis of left breast    ED Prescriptions    None     PDMP not reviewed this encounter.   Ok Edwards, PA-C 10/12/19 [phone]

## 2019-10-13 ENCOUNTER — Telehealth (HOSPITAL_COMMUNITY): Payer: Self-pay

## 2019-10-13 NOTE — Telephone Encounter (Signed)
Mom care of sore swollen white area on nipple/has now resolved.  Sounds like bleb.  Reviewed ways to try and get rid of blebs and possibly prevent from reoccurring.  Urged to call lactation as needed.

## 2019-10-24 IMAGING — DX DG CHEST 2V
2 series · 2 of 2 positions shown · non-contrast
Comparison: None.

CLINICAL DATA: Positive quantiferon gold test, no complains,
x-smoker.

EXAM:
CHEST - 2 VIEW

[chest pa]
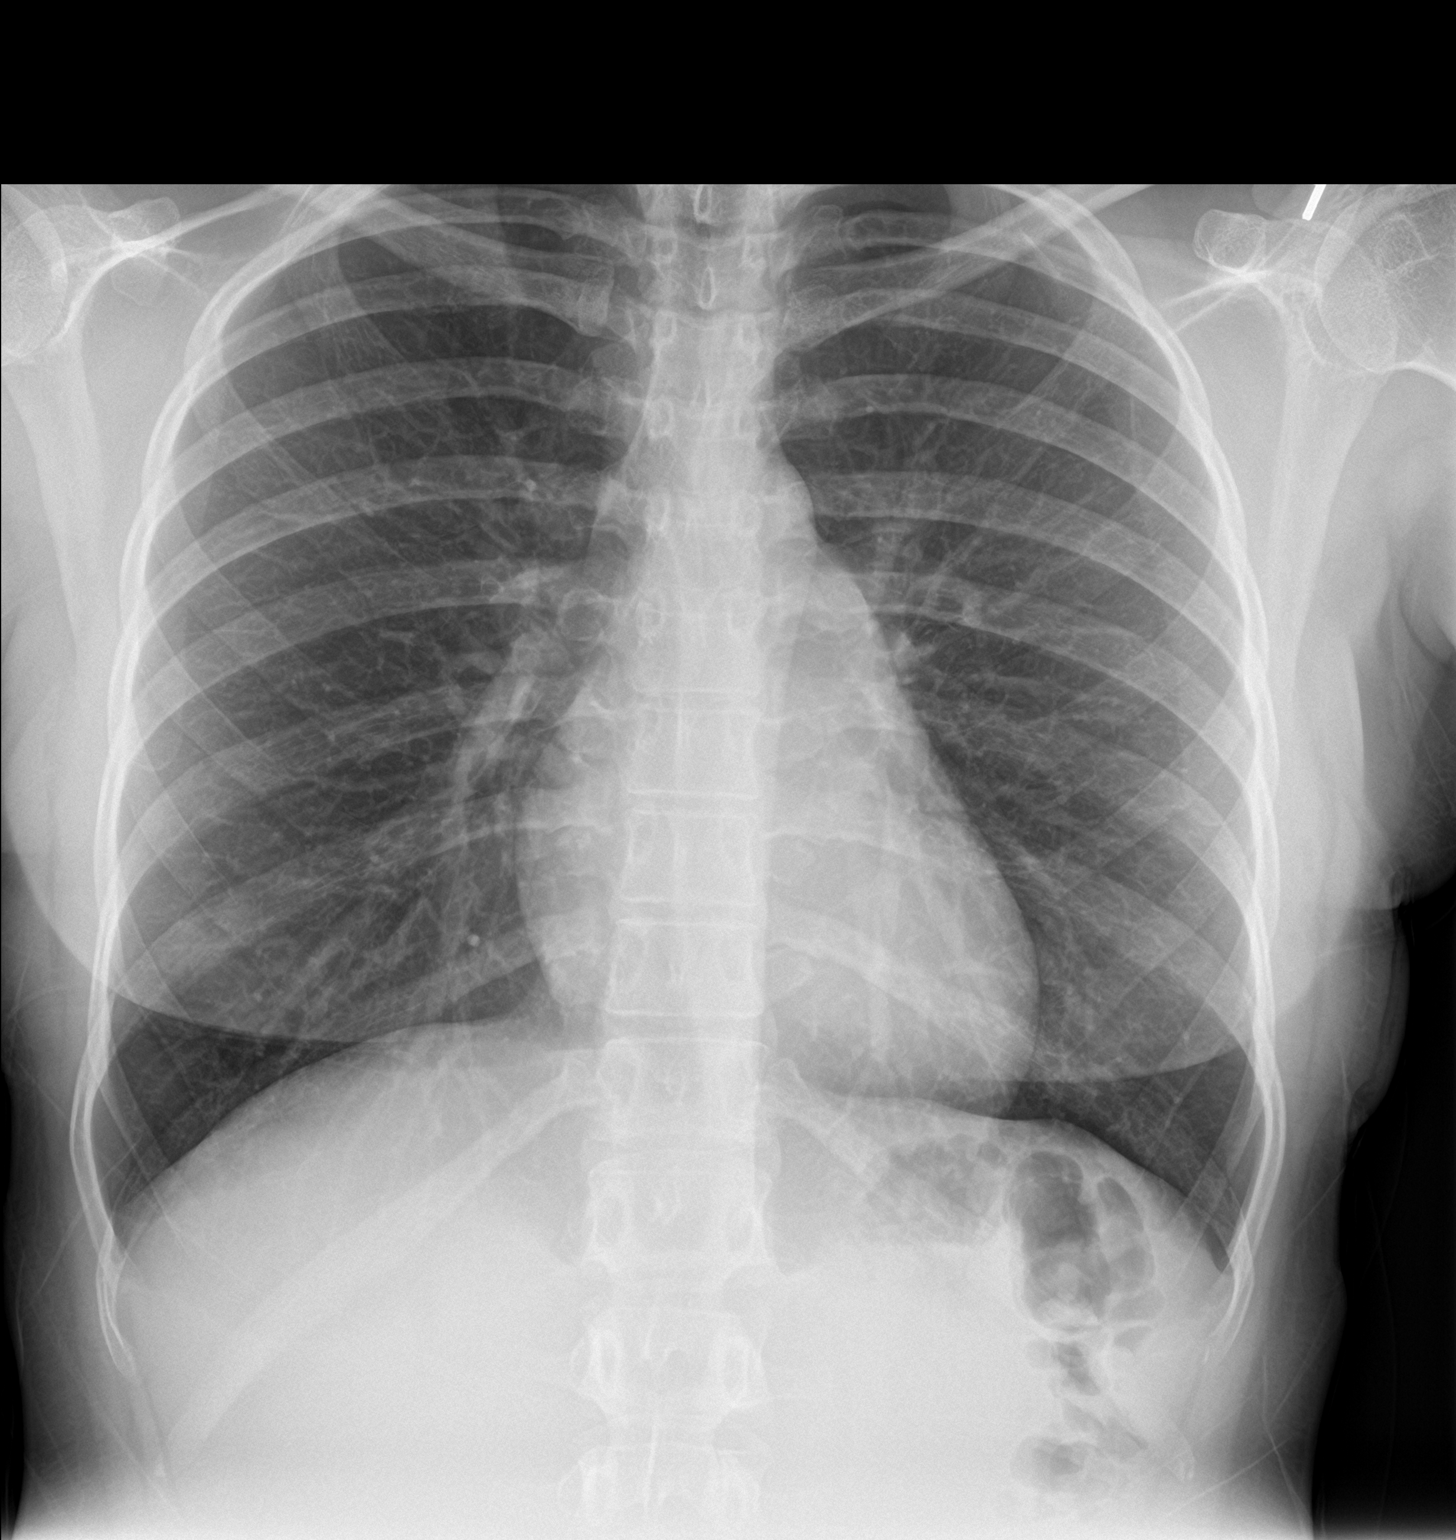

[chest lat]
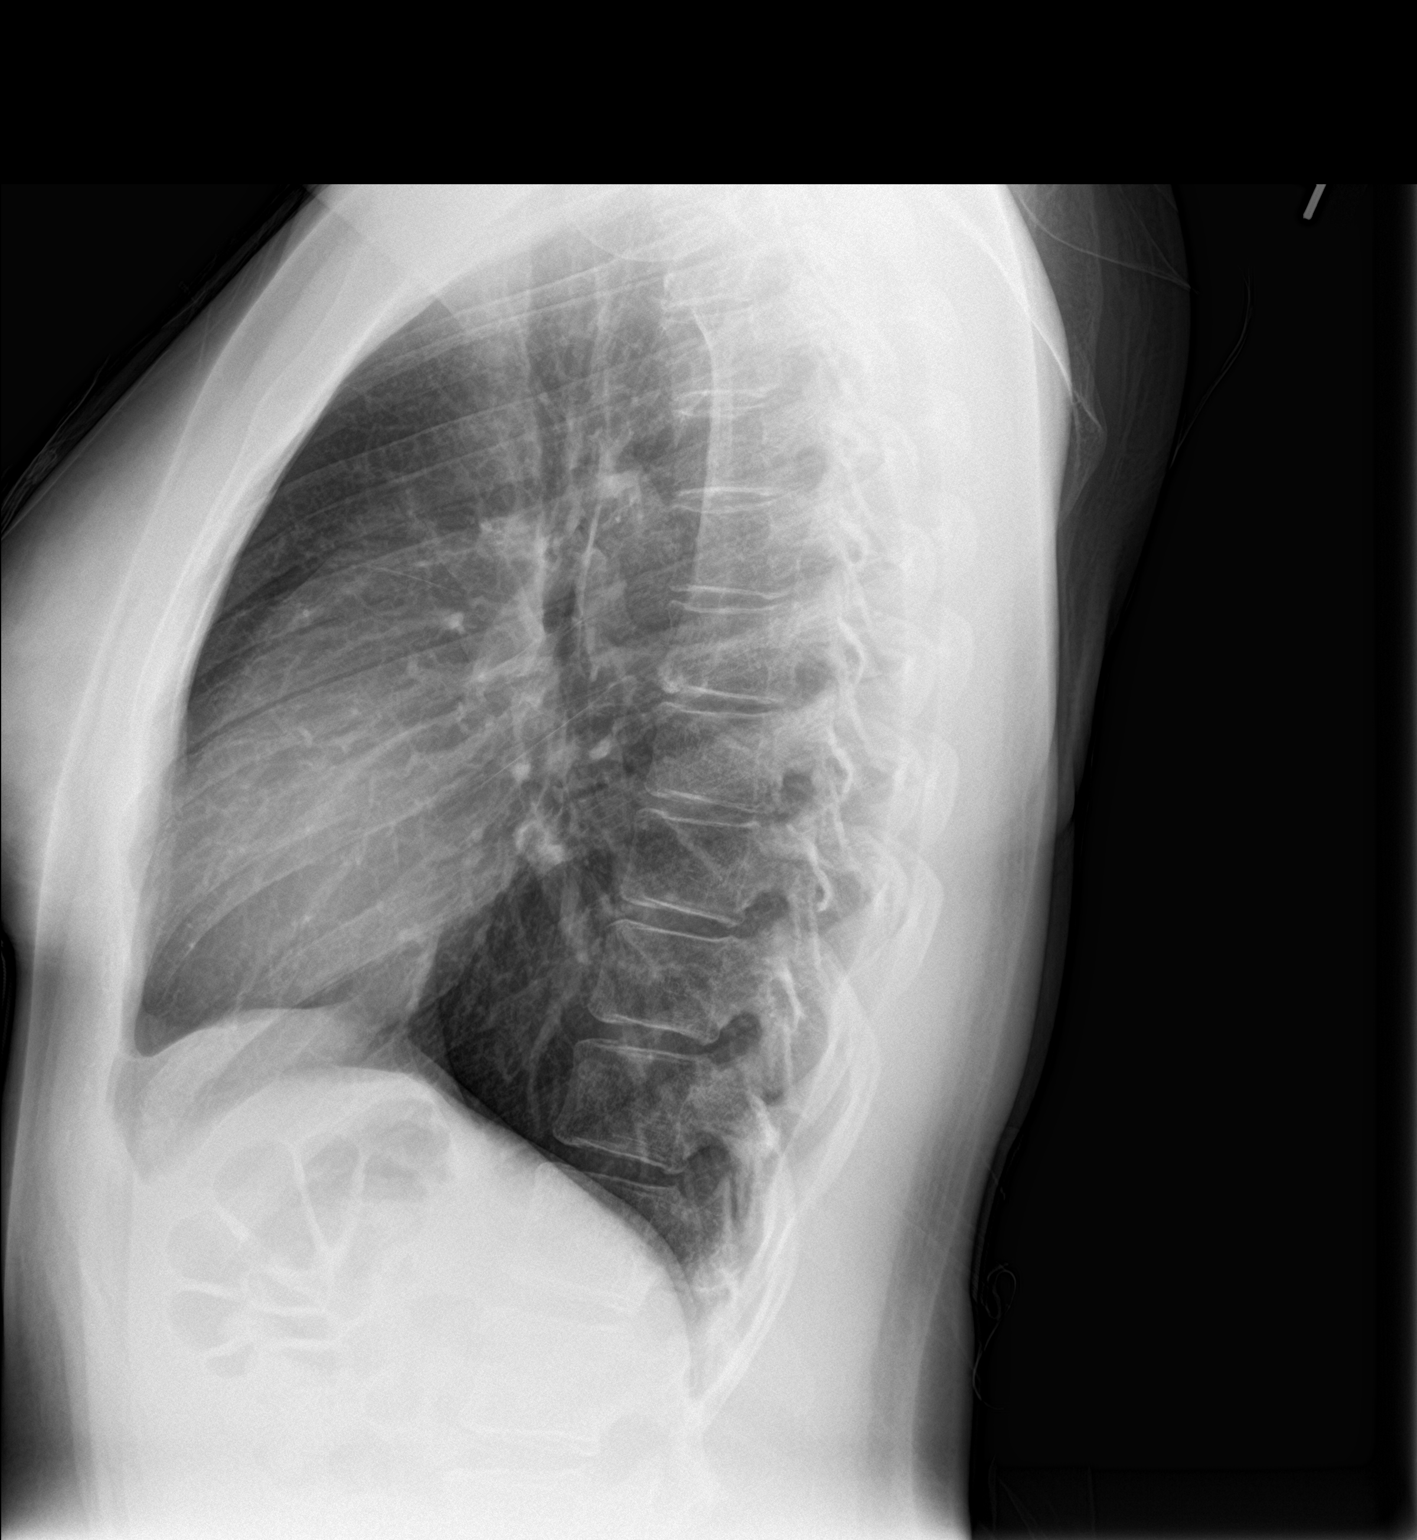

[2 of 2 positions shown; findings below may reference images not displayed]

FINDINGS: Normal heart, mediastinum and hila.

Lungs are clear.  No pleural effusion or pneumothorax.

Skeletal structures are within normal limits.
IMPRESSION: Normal chest radiographs.

## 2020-02-27 ENCOUNTER — Ambulatory Visit (INDEPENDENT_AMBULATORY_CARE_PROVIDER_SITE_OTHER): Payer: PRIVATE HEALTH INSURANCE | Admitting: Family Medicine

## 2020-02-27 ENCOUNTER — Encounter: Payer: Self-pay | Admitting: Family Medicine

## 2020-02-27 ENCOUNTER — Other Ambulatory Visit: Payer: Self-pay

## 2020-02-27 VITALS — BP 120/80 | HR 85 | Temp 98.4°F | Ht 62.0 in | Wt 140.0 lb

## 2020-02-27 DIAGNOSIS — F419 Anxiety disorder, unspecified: Secondary | ICD-10-CM | POA: Diagnosis not present

## 2020-02-27 DIAGNOSIS — R5383 Other fatigue: Secondary | ICD-10-CM | POA: Diagnosis not present

## 2020-02-27 NOTE — Progress Notes (Signed)
Subjective:    Patient ID: Peggy Becker, female    DOB: 01-Mar-1996, 24 y.o.   MRN: [id number]  No chief complaint on file.   HPI Patient was seen today for ongoing concern.  Pt endorses increased fatigue and anxiety for several months.  Pt initially thought fatigue was from having twin girls in March.  Pt at times feeling down, having little interest in doing things, and wanting to sleep more.    Past Medical History:  Diagnosis Date  . Medical history non-contributory     No Known Allergies  ROS General: Denies fever, chills, night sweats, changes in weight, changes in appetite  +fatigue HEENT: Denies headaches, ear pain, changes in vision, rhinorrhea, sore throat CV: Denies CP, palpitations, SOB, orthopnea Pulm: Denies SOB, cough, wheezing GI: Denies abdominal pain, nausea, vomiting, diarrhea, constipation GU: Denies dysuria, hematuria, frequency, vaginal discharge Msk: Denies muscle cramps, joint pains Neuro: Denies weakness, numbness, tingling Skin: Denies rashes, bruising Psych: Denies depression, hallucinations, SI/HI  +anxiety     Objective:    Blood pressure 120/80, pulse 85, temperature 98.4 F (36.9 C), temperature source Oral, height 5' 2"  (1.575 m), weight 140 lb (63.5 kg), SpO2 98 %, currently breastfeeding.  Gen. Pleasant, well-nourished, in no distress, normal affect   HEENT: /AT, face symmetric, conjunctiva clear, no scleral icterus, PERRLA, EOMI, nares patent without drainage Lungs: no accessory muscle use Cardiovascular: RRR, no peripheral edema Musculoskeletal: No deformities, no cyanosis or clubbing, normal tone Neuro:  A&Ox3, CN II-XII intact, normal gait Skin:  Warm, no lesions/ rash  Wt Readings from Last 3 Encounters:  02/27/20 140 lb (63.5 kg)  07/23/19 143 lb 4.8 oz (65 kg)  01/31/18 125 lb (56.7 kg)    Lab Results  Component Value Date   WBC 12.3 (H) 07/24/2019   HGB 9.7 (L) 07/24/2019   HCT 28.8 (L) 07/24/2019   PLT 180  07/24/2019    Assessment/Plan:  Fatigue, unspecified type  -Discussed various causes including thyroid dysfunction, depression, vitamin def., and being a new mother -will obtain labs -discussed exercising when able, healthy diet choices, and other ways to help improve fatigue - Plan: CBC with Differential/Platelet, Vitamin B12, Vitamin D, 25-hydroxy, TSH, T4, free, BMP with eGFR([facility])  Anxiety  -PHQ 9 score 5 -GAD 7 score 14 -Discussed possible causes including thyroid dysfunction and major life changes such as family dynamic  -discussed various treatment options including medication and counseling -pt to consider counseling.  Given info on area [facility] providers -will obtain labs -Self care encouraged. -given handout - Plan: TSH, T4, free  F/u in 1 month, sooner if needed  Grier Mitts, MD

## 2020-02-27 NOTE — Patient Instructions (Signed)
Fatigue If you have fatigue, you feel tired all the time and have a lack of energy or a lack of motivation. Fatigue may make it difficult to start or complete tasks because of exhaustion. In general, occasional or mild fatigue is often a normal response to activity or life. However, long-lasting (chronic) or extreme fatigue may be a symptom of a medical condition. Follow these instructions at home: General instructions  Watch your fatigue for any changes.  Go to bed and get up at the same time every day.  Avoid fatigue by pacing yourself during the day and getting enough sleep at night.  Maintain a healthy weight. Medicines  Take over-the-counter and prescription medicines only as told by your health care provider.  Take a multivitamin, if told by your health care provider.  Do not use herbal or dietary supplements unless they are approved by your health care provider. Activity   Exercise regularly, as told by your health care provider.  Use or practice techniques to help you relax, such as yoga, tai chi, meditation, or massage therapy. Eating and drinking   Avoid heavy meals in the evening.  Eat a well-balanced diet, which includes lean proteins, whole grains, plenty of fruits and vegetables, and low-fat dairy products.  Avoid consuming too much caffeine.  Avoid the use of alcohol.  Drink enough fluid to keep your urine pale yellow. Lifestyle  Change situations that cause you stress. Try to keep your work and personal schedule in balance.  Do not use any products that contain nicotine or tobacco, such as cigarettes and e-cigarettes. If you need help quitting, ask your health care provider.  Do not use drugs. Contact a health care provider if:  Your fatigue does not get better.  You have a fever.  You suddenly lose or gain weight.  You have headaches.  You have trouble falling asleep or sleeping through the night.  You feel angry, guilty, anxious, or sad.   You are unable to have a bowel movement (constipation).  Your skin is dry.  You have swelling in your legs or another part of your body. Get help right away if:  You feel confused.  Your vision is blurry.  You feel faint or you pass out.  You have a severe headache.  You have severe pain in your abdomen, your back, or the area between your waist and hips (pelvis).  You have chest pain, shortness of breath, or an irregular or fast heartbeat.  You are unable to urinate, or you urinate less than normal.  You have abnormal bleeding, such as bleeding from the rectum, vagina, nose, lungs, or nipples.  You vomit blood.  You have thoughts about hurting yourself or others. If you ever feel like you may hurt yourself or others, or have thoughts about taking your own life, get help right away. You can go to your nearest emergency department or call:  Your local emergency services (911 in the [place]  A suicide crisis helpline, such as the [facility] at (505)831-7817. This is open 24 hours a day. Summary  If you have fatigue, you feel tired all the time and have a lack of energy or a lack of motivation.  Fatigue may make it difficult to start or complete tasks because of exhaustion.  Long-lasting (chronic) or extreme fatigue may be a symptom of a medical condition.  Exercise regularly, as told by your health care provider.  Change situations that cause you stress. Try to keep your  work and personal schedule in balance. This information is not intended to replace advice given to you by your health care provider. Make sure you discuss any questions you have with your health care provider. Document Revised: 11/21/2018 Document Reviewed: 01/25/2017 Elsevier Patient Education  [place], Adult After being diagnosed with an anxiety disorder, you may be relieved to know why you have felt or behaved a certain way. You may also feel  overwhelmed about the treatment ahead and what it will mean for your life. With care and support, you can manage this condition and recover from it. How to manage lifestyle changes Managing stress and anxiety  Stress is your body's reaction to life changes and events, both good and bad. Most stress will last just a few hours, but stress can be ongoing and can lead to more than just stress. Although stress can play a major role in anxiety, it is not the same as anxiety. Stress is usually caused by something external, such as a deadline, test, or competition. Stress normally passes after the triggering event has ended.  Anxiety is caused by something internal, such as imagining a terrible outcome or worrying that something will go wrong that will devastate you. Anxiety often does not go away even after the triggering event is over, and it can become long-term (chronic) worry. It is important to understand the differences between stress and anxiety and to manage your stress effectively so that it does not lead to an anxious response. Talk with your health care provider or a counselor to learn more about reducing anxiety and stress. He or she may suggest tension reduction techniques, such as:  Music therapy. This can include creating or listening to music that you enjoy and that inspires you.  Mindfulness-based meditation. This involves being aware of your normal breaths while not trying to control your breathing. It can be done while sitting or walking.  Centering prayer. This involves focusing on a word, phrase, or sacred image that means something to you and brings you peace.  Deep breathing. To do this, expand your stomach and inhale slowly through your nose. Hold your breath for 3-5 seconds. Then exhale slowly, letting your stomach muscles relax.  Self-talk. This involves identifying thought patterns that lead to anxiety reactions and changing those patterns.  Muscle relaxation. This involves  tensing muscles and then relaxing them. Choose a tension reduction technique that suits your lifestyle and personality. These techniques take time and practice. Set aside 5-15 minutes a day to do them. Therapists can offer counseling and training in these techniques. The training to help with anxiety may be covered by some insurance plans. Other things you can do to manage stress and anxiety include:  Keeping a stress/anxiety diary. This can help you learn what triggers your reaction and then learn ways to manage your response.  Thinking about how you react to certain situations. You may not be able to control everything, but you can control your response.  Making time for activities that help you relax and not feeling guilty about spending your time in this way.  Visual imagery and yoga can help you stay calm and relax.  Medicines Medicines can help ease symptoms. Medicines for anxiety include:  Anti-anxiety drugs.  Antidepressants. Medicines are often used as a primary treatment for anxiety disorder. Medicines will be prescribed by a health care provider. When used together, medicines, psychotherapy, and tension reduction techniques may be the most effective treatment. Relationships Relationships can  play a big part in helping you recover. Try to spend more time connecting with trusted friends and family members. Consider going to couples counseling, taking family education classes, or going to family therapy. Therapy can help you and others better understand your condition. How to recognize changes in your anxiety Everyone responds differently to treatment for anxiety. Recovery from anxiety happens when symptoms decrease and stop interfering with your daily activities at home or work. This may mean that you will start to:  Have better concentration and focus. Worry will interfere less in your daily thinking.  Sleep better.  Be less irritable.  Have more energy.  Have improved  memory. It is important to recognize when your condition is getting worse. Contact your health care provider if your symptoms interfere with home or work and you feel like your condition is not improving. Follow these instructions at home: Activity  Exercise. Most adults should do the following: ? Exercise for at least 150 minutes each week. The exercise should increase your heart rate and make you sweat (moderate-intensity exercise). ? Strengthening exercises at least twice a week.  Get the right amount and quality of sleep. Most adults need 7-9 hours of sleep each night. Lifestyle   Eat a healthy diet that includes plenty of vegetables, fruits, whole grains, low-fat dairy products, and lean protein. Do not eat a lot of foods that are high in solid fats, added sugars, or salt.  Make choices that simplify your life.  Do not use any products that contain nicotine or tobacco, such as cigarettes, e-cigarettes, and chewing tobacco. If you need help quitting, ask your health care provider.  Avoid caffeine, alcohol, and certain over-the-counter cold medicines. These may make you feel worse. Ask your pharmacist which medicines to avoid. General instructions  Take over-the-counter and prescription medicines only as told by your health care provider.  Keep all follow-up visits as told by your health care provider. This is important. Where to find support You can get help and support from these sources:  Self-help groups.  Online and [employer].  A trusted spiritual leader.  Couples counseling.  Family education classes.  Family therapy. Where to find more information You may find that joining a support group helps you deal with your anxiety. The following sources can help you locate counselors or support groups near you:  [place]: www.mentalhealthamerica.net  Anxiety and Depression Association of [place] (ADAA): https://www.clark.net/  National Alliance on Mental  Illness (NAMI): www.nami.org Contact a health care provider if you:  Have a hard time staying focused or finishing daily tasks.  Spend many hours a day feeling worried about everyday life.  Become exhausted by worry.  Start to have headaches, feel tense, or have nausea.  Urinate more than normal.  Have diarrhea. Get help right away if you have:  A racing heart and shortness of breath.  Thoughts of hurting yourself or others. If you ever feel like you may hurt yourself or others, or have thoughts about taking your own life, get help right away. You can go to your nearest emergency department or call:  Your local emergency services (911 in the [place]  A suicide crisis helpline, such as the [facility] at 412-785-2830. This is open 24 hours a day. Summary  Taking steps to learn and use tension reduction techniques can help calm you and help prevent triggering an anxiety reaction.  When used together, medicines, psychotherapy, and tension reduction techniques may be the most effective  treatment.  Family, friends, and partners can play a big part in helping you recover from an anxiety disorder. This information is not intended to replace advice given to you by your health care provider. Make sure you discuss any questions you have with your health care provider. Document Revised: 10/02/2018 Document Reviewed: 10/02/2018 Elsevier Patient Education  [place], Adult Stress is a normal reaction to life events. Stress is what you feel when life demands more than you are used to, or more than you think you can handle. Some stress can be useful, such as studying for a test or meeting a deadline at work. Stress that occurs too often or for too long can cause problems. It can affect your emotional health and interfere with relationships and normal daily activities. Too much stress can weaken your body's defense system (immune system) and increase  your risk for physical illness. If you already have a medical problem, stress can make it worse. What are the causes? All sorts of life events can cause stress. An event that causes stress for one person may not be stressful for another person. Major life events, whether positive or negative, commonly cause stress. Examples include:  Losing a job or starting a new job.  Losing a loved one.  Moving to a new town or home.  Getting married or divorced.  Having a baby.  Getting injured or sick. Less obvious life events can also cause stress, especially if they occur day after day or in combination with each other. Examples include:  Working long hours.  Driving in traffic.  Caring for children.  Being in debt.  Being in a difficult relationship. What are the signs or symptoms? Stress can cause emotional symptoms, including:  Anxiety. This is feeling worried, afraid, on edge, overwhelmed, or out of control.  Anger, including irritation or impatience.  Depression. This is feeling sad, down, helpless, or guilty.  Trouble focusing, remembering, or making decisions. Stress can cause physical symptoms, including:  Aches and pains. These may affect your head, neck, back, stomach, or other areas of your body.  Tight muscles or a clenched jaw.  Low energy.  Trouble sleeping. Stress can cause unhealthy behaviors, including:  Eating to feel better (overeating) or skipping meals.  Working too much or putting off tasks.  Smoking, drinking alcohol, or using drugs to feel better. How is this diagnosed? Stress is diagnosed through an assessment by your health care provider. He or she may diagnose this condition based on:  Your symptoms and any stressful life events.  Your medical history.  Tests to rule out other causes of your symptoms. Depending on your condition, your health care provider may refer you to a specialist for further evaluation. How is this treated?  Stress  management techniques are the recommended treatment for stress. Medicine is not typically recommended for the treatment of stress. Techniques to reduce your reaction to stressful life events include:  Stress identification. Monitor yourself for symptoms of stress and identify what causes stress for you. These skills may help you to avoid or prepare for stressful events.  Time management. Set your priorities, keep a calendar of events, and learn to say no. Taking these actions can help you avoid making too many commitments. Techniques for coping with stress include:  Rethinking the problem. Try to think realistically about stressful events rather than ignoring them or overreacting. Try to find the positives in a stressful situation rather than focusing on the negatives.  Exercise.  Physical exercise can release both physical and emotional tension. The key is to find a form of exercise that you enjoy and do it regularly.  Relaxation techniques. These relax the body and mind. The key is to find one or more that you enjoy and use the techniques regularly. Examples include: ? Meditation, deep breathing, or progressive relaxation techniques. ? Yoga or tai chi. ? Biofeedback, mindfulness techniques, or journaling. ? Listening to music, being out in nature, or participating in other hobbies.  Practicing a healthy lifestyle. Eat a balanced diet, drink plenty of water, limit or avoid caffeine, and get plenty of sleep.  Having a strong support network. Spend time with family, friends, or other people you enjoy being around. Express your feelings and talk things over with someone you trust. Counseling or talk therapy with a mental health professional may be helpful if you are having trouble managing stress on your own. Follow these instructions at home: Lifestyle   Avoid drugs.  Do not use any products that contain nicotine or tobacco, such as cigarettes, e-cigarettes, and chewing tobacco. If you need  help quitting, ask your health care provider.  Limit alcohol intake to no more than 1 drink a day for nonpregnant women and 2 drinks a day for men. One drink equals 12 oz of beer, 5 oz of wine, or 1 oz of hard liquor  Do not use alcohol or drugs to relax.  Eat a balanced diet that includes fresh fruits and vegetables, whole grains, lean meats, fish, eggs, and beans, and low-fat dairy. Avoid processed foods and foods high in added fat, sugar, and salt.  Exercise at least 30 minutes on 5 or more days each week.  Get 7-8 hours of sleep each night. General instructions   Practice stress management techniques as discussed with your health care provider.  Drink enough fluid to keep your urine clear or pale yellow.  Take over-the-counter and prescription medicines only as told by your health care provider.  Keep all follow-up visits as told by your health care provider. This is important. Contact a health care provider if:  Your symptoms get worse.  You have new symptoms.  You feel overwhelmed by your problems and can no longer manage them on your own. Get help right away if:  You have thoughts of hurting yourself or others. If you ever feel like you may hurt yourself or others, or have thoughts about taking your own life, get help right away. You can go to your nearest emergency department or call:  Your local emergency services (911 in the [place]  A suicide crisis helpline, such as the [facility] at (413) 348-0958. This is open 24 hours a day. Summary  Stress is a normal reaction to life events. It can cause problems if it happens too often or for too long.  Practicing stress management techniques is the best way to treat stress.  Counseling or talk therapy with a mental health professional may be helpful if you are having trouble managing stress on your own. This information is not intended to replace advice given to you by your health care provider.  Make sure you discuss any questions you have with your health care provider. Document Revised: 11/30/2018 Document Reviewed: 06/22/2016 Elsevier Patient Education  [place].  Breast Pumping Tips There may be times when you cannot feed your baby from your breast, such as when you are at work or on a trip. Breast pumping allows you to remove milk  from your breast in order to store for later use. There are three ways to pump. You can use:  Your hand to massage and squeeze your breast (hand expression).  A handheld manual pump.  An electric pump. When you first start to pump, you may not get much milk, but after a few days your breasts should start to make more. Pumping can help stimulate your milk supply after your baby is born. It can also help maintain your milk supply when you are away from your baby. When should I pump? You can start pumping soon after your baby is born. Here are some tips on when to pump:  When with your baby: ? Pump after breastfeeding. ? Pump from the free breast while you breastfeed.  When away from your baby: ? Pump every 2-3 hours for about 15 minutes. ? Pump both breasts at the same time if you can.  If your baby gets formula feeding, pump around the time your baby gets that feeding.  If you drank alcohol, wait 2 hours before pumping.  If you are having a procedure with anesthesia, talk to your health care provider about when you should pump before and after. How do I prepare to pump? Take steps to relax. This makes it easier to stimulate your let-down reflex, which is what makes breast milk flow. To help:  Smell one of your infant's blankets or an item of clothing.  Look at a picture or video of your infant.  Sit in a quiet, private space.  Massage your breast and nipple.  Place a warm cloth on your breast. The cloth should be a little wet.  Play relaxing music.  Picture your milk flowing. What are some tips? General tips for pumping  breast milk   Always wash your hands before pumping.  If you are not getting very much milk or pumping is uncomfortable, make adjustments to your pump or try using different type of pumps.  Drink enough fluid to keep your urine clear or pale yellow.  Wear clothing that opens in the front or allows easy access to your breasts.  Pump breast milk directly into clean bottles or other storage containers.  Do not use any products that contain nicotine or tobacco, such as cigarettes and e-cigarettes. These can lower your milk supply and harm your infant. If you need help quitting, ask your health care provider. Tips for storing breast milk   Store breast milk in a clean, BPA-free container, such as glass or plastic bottles or milk storage bags.  Store breast milk in 2-4 ounce batches to reduce waste.  Swirl the breast milk in the container to mix any cream that floats to the top. Do not shake it.  Label all stored milk with the date you pumped it.  The amount of time you can keep breast milk depends on where it is stored: ? Room temperature: 6-8 hours, if the milk is clean. It is best if used within 4 hours. ? Cooler with ice packs: 24 hours. ? Refrigerator: 5-8 days, if the milk is clean. It is best if used within 3 days. ? Freezer: 9-12 months, if the milk is clean and stored away from the freezer door. It is best if used within 6 months.  When using a refrigerator or freezer, put the milk in the back to keep it as cold as possible.  Thaw frozen milk using warm water. Do not use the microwave. Tips for choosing a breast pump The  right pump for you will depend on your comfort and how often you will be away from your baby. When choosing a pump, consider the following:  Manual breast pumps do not need electricity to work. They are usually cheaper than electric pumps, but they can be harder to use. They may be a good choice if you are occasionally away from your baby.  Electric breast  pumps are usually more expensive than manual pumps, but they can be easier for some women to use. They can also collect more milk than manual pumps. This makes them a good choice for women who work in an office or need to be away from their baby for longer periods of time.  The suction cup (flange) should be the right size. If it is the wrong size, it may cause pain and nipple damage.  Before buying a pump, find out whether your insurance covers the cost of a breast pump. Tips for maintaining a breast pump  Check your pump's manual for cleaning tips.  Clean the pump after each use. To do this: ? Wipe down the electrical unit. Use a dry, soft cloth or clean paper towel. Do not put the electrical unit in water or cleaning products. ? Wash the plastic pump parts with soap and warm water or in the dishwasher, if the parts are dishwasher safe. You do not need to clean the tubing unless it comes in contact with breast milk. Let the parts air dry. Avoid drying them with a cloth or towel. ? When the pump parts are clean and dry, put the pump back together. Then store the pump.  If there is water in the tubing when it comes time to pump, attach the tubing to the pump and turn on the pump. Run the pump until the tube is dry.  Avoid touching the inside of pump parts that come in contact with breast milk. Summary  Pumping can help stimulate your milk supply after your baby is born. It can also help maintain your milk supply when you are away from your baby.  When you are away from your infant for several hours, pump for about 15 minutes every 2-3 hours. Pump both breasts at the same time, if you can.  Your health care provider or lactation consultant can help you decide which breast pump is right for you. The right pump for you depends on your comfort, work schedule, and how often you may be away from your baby. This information is not intended to replace advice given to you by your health care provider.  Make sure you discuss any questions you have with your health care provider. Document Revised: 08/22/2018 Document Reviewed: 06/06/2016 Elsevier Patient Education  2020 [employer].

## 2020-02-28 ENCOUNTER — Other Ambulatory Visit: Payer: Self-pay | Admitting: Family Medicine

## 2020-02-28 DIAGNOSIS — E559 Vitamin D deficiency, unspecified: Secondary | ICD-10-CM

## 2020-02-28 LAB — BASIC METABOLIC PANEL WITH GFR
BUN: 8 mg/dL (ref 7–25)
CO2: 28 mmol/L (ref 20–32)
Calcium: 9.4 mg/dL (ref 8.6–10.2)
Chloride: 104 mmol/L (ref 98–110)
Creat: 0.56 mg/dL (ref 0.50–1.10)
GFR, Est African American: 152 mL/min/{1.73_m2} (ref >=60)
GFR, Est Non African American: 131 mL/min/{1.73_m2} (ref >=60)
Glucose, Bld: 94 mg/dL (ref 65–99)
Potassium: 3.9 mmol/L (ref 3.5–5.3)
Sodium: 139 mmol/L (ref 135–146)

## 2020-02-28 LAB — VITAMIN D 25 HYDROXY (VIT D DEFICIENCY, FRACTURES): Vit D, 25-Hydroxy: 26 ng/mL — ABNORMAL LOW (ref 30–100)

## 2020-02-28 LAB — CBC WITH DIFFERENTIAL/PLATELET
Absolute Monocytes: 490 cells/uL (ref 200–950)
Basophils Absolute: 40 cells/uL (ref 0–200)
Basophils Relative: 0.7 %
Eosinophils Absolute: 211 cells/uL (ref 15–500)
Eosinophils Relative: 3.7 %
HCT: 37.8 % (ref 35.0–45.0)
Hemoglobin: 12.5 g/dL (ref 11.7–15.5)
Lymphs Abs: 2445 cells/uL (ref 850–3900)
MCH: 29.3 pg (ref 27.0–33.0)
MCHC: 33.1 g/dL (ref 32.0–36.0)
MCV: 88.7 fL (ref 80.0–100.0)
MPV: 9.8 fL (ref 7.5–12.5)
Monocytes Relative: 8.6 %
Neutro Abs: 2514 cells/uL (ref 1500–7800)
Neutrophils Relative %: 44.1 %
Platelets: 326 10*3/uL (ref 140–400)
RBC: 4.26 10*6/uL (ref 3.80–5.10)
RDW: 12 % (ref 11.0–15.0)
Total Lymphocyte: 42.9 %
WBC: 5.7 10*3/uL (ref 3.8–10.8)

## 2020-02-28 LAB — T4, FREE: Free T4: 1.1 ng/dL (ref 0.8–1.8)

## 2020-02-28 LAB — TSH: TSH: 1.02 mIU/L

## 2020-02-28 LAB — VITAMIN B12: Vitamin B-12: 788 pg/mL (ref 200–1100)

## 2020-02-28 MED ORDER — VITAMIN D (ERGOCALCIFEROL) 1.25 MG (50000 UNIT) PO CAPS: 50000.0000 [IU] | ORAL_CAPSULE | ORAL | 0 refills | Status: DC

## 2020-05-20 ENCOUNTER — Other Ambulatory Visit: Payer: Self-pay

## 2020-05-21 ENCOUNTER — Other Ambulatory Visit (HOSPITAL_COMMUNITY)
Admission: RE | Admit: 2020-05-21 | Discharge: 2020-05-21 | Disposition: A | Discharge status: Home / Self Care | Payer: PRIVATE HEALTH INSURANCE | Source: Ambulatory Visit | Attending: Family Medicine | Admitting: Family Medicine

## 2020-05-21 ENCOUNTER — Encounter: Payer: Self-pay | Admitting: Family Medicine

## 2020-05-21 ENCOUNTER — Ambulatory Visit (INDEPENDENT_AMBULATORY_CARE_PROVIDER_SITE_OTHER): Payer: PRIVATE HEALTH INSURANCE | Admitting: Family Medicine

## 2020-05-21 VITALS — BP 110/78 | HR 78 | Temp 98.2°F | Wt 147.6 lb

## 2020-05-21 DIAGNOSIS — B9689 Other specified bacterial agents as the cause of diseases classified elsewhere: Secondary | ICD-10-CM | POA: Insufficient documentation

## 2020-05-21 DIAGNOSIS — E559 Vitamin D deficiency, unspecified: Secondary | ICD-10-CM

## 2020-05-21 DIAGNOSIS — N760 Acute vaginitis: Secondary | ICD-10-CM | POA: Insufficient documentation

## 2020-05-21 DIAGNOSIS — B373 Candidiasis of vulva and vagina: Secondary | ICD-10-CM | POA: Insufficient documentation

## 2020-05-21 DIAGNOSIS — Z113 Encounter for screening for infections with a predominantly sexual mode of transmission: Secondary | ICD-10-CM | POA: Diagnosis not present

## 2020-05-21 LAB — POCT URINALYSIS DIPSTICK
Bilirubin, UA: NEGATIVE
Blood, UA: NEGATIVE
Glucose, UA: NEGATIVE
Ketones, UA: NEGATIVE
Leukocytes, UA: NEGATIVE
Nitrite, UA: NEGATIVE
Protein, UA: POSITIVE — AB
Spec Grav, UA: >=1.03 — AB (ref 1.010–1.025)
Urobilinogen, UA: 0.2 E.U./dL
pH, UA: 6 (ref 5.0–8.0)

## 2020-05-21 MED ORDER — FLUCONAZOLE 150 MG PO TABS: 150.0000 mg | ORAL_TABLET | Freq: Once | ORAL | 0 refills | Status: AC

## 2020-05-21 MED ORDER — METRONIDAZOLE 500 MG PO TABS: 500.0000 mg | ORAL_TABLET | Freq: Two times a day (BID) | ORAL | 0 refills | Status: AC

## 2020-05-21 NOTE — Progress Notes (Signed)
Subjective:    Patient ID: Peggy Becker, female    DOB: 07/24/1995, 25 y.o.   MRN: [id number]  No chief complaint on file.   HPI Patient was seen today for acute concern.  Patient endorses improvement in fatigue since starting vitamin D supplement.  Ppatient also notes vaginal discharge, odor, and irritation which started over the weekend.  Patient states discharge is clear to yellow in color similar to previous episodes of BV.  Patient endorses recent intercourse.  Patient denies dysuria, suprapubic pain, nausea, vomiting, back pain, fever, chills.  Patient notes increased stress due to increased responsibilities at work.  Patient coming home from work, plays with her girls, feels tired, and in bed by 9 pm.  Past Medical History:  Diagnosis Date  . Medical history non-contributory     No Known Allergies  ROS General: Denies fever, chills, night sweats, changes in weight, changes in appetite HEENT: Denies headaches, ear pain, changes in vision, rhinorrhea, sore throat CV: Denies CP, palpitations, SOB, orthopnea Pulm: Denies SOB, cough, wheezing GI: Denies abdominal pain, nausea, vomiting, diarrhea, constipation GU: Denies dysuria, hematuria, frequency, vaginal discharge/irritation Msk: Denies muscle cramps, joint pains Neuro: Denies weakness, numbness, tingling Skin: Denies rashes, bruising Psych: Denies depression, anxiety, hallucinations+ increased stress     Objective:    Blood pressure 110/78, pulse 78, temperature 98.2 F (36.8 C), temperature source Oral, weight 147 lb 9.6 oz (67 kg), SpO2 99 %, not currently breastfeeding.   Gen. Pleasant, well-nourished, in no distress, normal affect   HEENT: [place]/AT, face symmetric, conjunctiva clear, no scleral icterus, PERRLA, EOMI, nares patent without drainage Lungs: no accessory muscle use Cardiovascular: RRR, no peripheral edema Musculoskeletal: No deformities, no cyanosis or clubbing, normal tone GU: pt self swab done.  Exam  deferred. Neuro:  A&Ox3, CN II-XII intact, normal gait Skin:  Warm, no lesions/ rash   Wt Readings from Last 3 Encounters:  05/21/20 147 lb 9.6 oz (67 kg)  02/27/20 140 lb (63.5 kg)  07/23/19 143 lb 4.8 oz (65 kg)    Lab Results  Component Value Date   WBC 5.7 02/27/2020   HGB 12.5 02/27/2020   HCT 37.8 02/27/2020   PLT 326 02/27/2020   GLUCOSE 94 02/27/2020   NA 139 02/27/2020   K 3.9 02/27/2020   CL 104 02/27/2020   CREATININE 0.56 02/27/2020   BUN 8 02/27/2020   CO2 28 02/27/2020   TSH 1.02 02/27/2020    Assessment/Plan:  Acute vaginitis -Given handout -Abdomen self swab obtained -Discussed starting treatment while awaiting results -Given precautions - Plan: metroNIDAZOLE (FLAGYL) 500 MG tablet, fluconazole (DIFLUCAN) 150 MG tablet, POCT urinalysis dipstick  Vitamin D deficiency -Complete ergocalciferol 50,000 IUs weekly then start daily vitamin D supplement 1 to 2000 IUs -We will recheck vitamin D level in the next few months  F/u as needed  Abbe [place], MD

## 2020-05-21 NOTE — Patient Instructions (Addendum)
No UTI noted, but you should increase your intake of water.  We will contact you about the results of your other labs.  Refrain from drinking alcohol while taking your medication as it can make you feel sick.  Vitamin D Deficiency Vitamin D deficiency is when your body does not have enough vitamin D. Vitamin D is important to your body for many reasons:  It helps the body absorb two important minerals--calcium and phosphorus.  It plays a role in bone health.  It may help to prevent some diseases, such as diabetes and multiple sclerosis.  It plays a role in muscle function, including heart function. If vitamin D deficiency is severe, it can cause a condition in which your bones become soft. In adults, this condition is called osteomalacia. In children, this condition is called rickets. What are the causes? This condition may be caused by:  Not eating enough foods that contain vitamin D.  Not getting enough natural sun exposure.  Having certain digestive system diseases that make it difficult for your body to absorb vitamin D. These diseases include Crohn's disease, chronic pancreatitis, and cystic fibrosis.  Having a surgery in which a part of the stomach or a part of the small intestine is removed.  Having chronic kidney disease or liver disease. What increases the risk? You are more likely to develop this condition if you:  Are older.  Do not spend much time outdoors.  Live in a long-term care facility.  Have had broken bones.  Have weak or thin bones (osteoporosis).  Have a disease or condition that changes how the body absorbs vitamin D.  Have dark skin.  Take certain medicines, such as steroid medicines or certain seizure medicines.  Are overweight or obese. What are the signs or symptoms? In mild cases of vitamin D deficiency, there may not be any symptoms. If the condition is severe, symptoms may include:  Bone pain.  Muscle pain.  Falling often.  Broken  bones caused by a minor injury. How is this diagnosed? This condition may be diagnosed with blood tests. Imaging tests such as X-rays may also be done to look for changes in the bone. How is this treated? Treatment for this condition may depend on what caused the condition. Treatment options include:  Taking vitamin D supplements. Your health care provider will suggest what dose is best for you.  Taking a calcium supplement. Your health care provider will suggest what dose is best for you. Follow these instructions at home: Eating and drinking   Eat foods that contain vitamin D. Choices include: ? Fortified dairy products, cereals, or juices. Fortified means that vitamin D has been added to the food. Check the label on the package to see if the food is fortified. ? Fatty fish, such as salmon or trout. ? Eggs. ? Oysters. ? Mushrooms. The items listed above may not be a complete list of recommended foods and beverages. Contact a dietitian for more information. General instructions  Take medicines and supplements only as told by your health care provider.  Get regular, safe exposure to natural sunlight.  Do not use a tanning bed.  Maintain a healthy weight. Lose weight if needed.  Keep all follow-up visits as told by your health care provider. This is important. How is this prevented? You can get vitamin D by:  Eating foods that naturally contain vitamin D.  Eating or drinking products that have been fortified with vitamin D, such as cereals, juices, and dairy products (  including milk).  Taking a vitamin D supplement or a multivitamin supplement that contains vitamin D.  Being in the sun. Your body naturally makes vitamin D when your skin is exposed to sunlight. Your body changes the sunlight into a form of the vitamin that it can use. Contact a health care provider if:  Your symptoms do not go away.  You feel nauseous or you vomit.  You have fewer bowel movements than  usual or are constipated. Summary  Vitamin D deficiency is when your body does not have enough vitamin D.  Vitamin D is important to your body for good bone health and muscle function, and it may help prevent some diseases.  Vitamin D deficiency is primarily treated through supplementation. Your health care provider will suggest what dose is best for you.  You can get vitamin D by eating foods that contain vitamin D, by being in the sun, and by taking a vitamin D supplement or a multivitamin supplement that contains vitamin D. This information is not intended to replace advice given to you by your health care provider. Make sure you discuss any questions you have with your health care provider. Document Revised: 01/08/2018 Document Reviewed: 01/08/2018 Elsevier Patient Education  [place].  Vaginitis Vaginitis is a condition in which the vaginal tissue swells and becomes red (inflamed). This condition is most often caused by a change in the normal balance of bacteria and yeast that live in the vagina. This change causes an overgrowth of certain bacteria or yeast, which causes the inflammation. There are different types of vaginitis, but the most common types are:  Bacterial vaginosis.  Yeast infection (candidiasis).  Trichomoniasis vaginitis. This is a sexually transmitted disease (STD).  Viral vaginitis.  Atrophic vaginitis.  Allergic vaginitis. What are the causes? The cause of this condition depends on the type of vaginitis. It can be caused by:  Bacteria (bacterial vaginosis).  Yeast, which is a fungus (yeast infection).  A parasite (trichomoniasis vaginitis).  A virus (viral vaginitis).  Low hormone levels (atrophic vaginitis). Low hormone levels can occur during pregnancy, breastfeeding, or after menopause.  Irritants, such as bubble baths, scented tampons, and feminine sprays (allergic vaginitis). Other factors can change the normal balance of the yeast and  bacteria that live in the vagina. These include:  Antibiotic medicines.  Poor hygiene.  Diaphragms, vaginal sponges, spermicides, birth control pills, and intrauterine devices (IUD).  Sex.  Infection.  Uncontrolled diabetes.  A weakened defense (immune) system. What increases the risk? This condition is more likely to develop in women who:  Smoke.  Use vaginal douches, scented tampons, or scented sanitary pads.  Wear tight-fitting pants.  Wear thong underwear.  Use oral birth control pills or an IUD.  Have sex without a condom.  Have multiple sex partners.  Have an STD.  Frequently use the spermicide nonoxynol-9.  Eat lots of foods high in sugar.  Have uncontrolled diabetes.  Have low estrogen levels.  Have a weakened immune system from an immune disorder or medical treatment.  Are pregnant or breastfeeding. What are the signs or symptoms? Symptoms vary depending on the cause of the vaginitis. Common symptoms include:  Abnormal vaginal discharge. ? The discharge is white, gray, or yellow with bacterial vaginosis. ? The discharge is thick, white, and cheesy with a yeast infection. ? The discharge is frothy and yellow or greenish with trichomoniasis.  A bad vaginal smell. The smell is fishy with bacterial vaginosis.  Vaginal itching, pain, or swelling.  Sex that is painful.  Pain or burning when urinating. Sometimes there are no symptoms. How is this diagnosed? This condition is diagnosed based on your symptoms and medical history. A physical exam, including a pelvic exam, will also be done. You may also have other tests, including:  Tests to determine the pH level (acidity or alkalinity) of your vagina.  A whiff test, to assess the odor that results when a sample of your vaginal discharge is mixed with a potassium hydroxide solution.  Tests of vaginal fluid. A sample will be examined under a microscope. How is this treated? Treatment varies  depending on the type of vaginitis you have. Your treatment may include:  Antibiotic creams or pills to treat bacterial vaginosis and trichomoniasis.  Antifungal medicines, such as vaginal creams or suppositories, to treat a yeast infection.  Medicine to ease discomfort if you have viral vaginitis. Your sexual partner should also be treated.  Estrogen delivered in a cream, pill, suppository, or vaginal ring to treat atrophic vaginitis. If vaginal dryness occurs, lubricants and moisturizing creams may help. You may need to avoid scented soaps, sprays, or douches.  Stopping use of a product that is causing allergic vaginitis. Then using a vaginal cream to treat the symptoms. Follow these instructions at home: Lifestyle  Keep your genital area clean and dry. Avoid soap, and only rinse the area with water.  Do not douche or use tampons until your health care provider says it is okay to do so. Use sanitary pads, if needed.  Do not have sex until your health care provider approves. When you can return to sex, practice safe sex and use condoms.  Wipe from front to back. This avoids the spread of bacteria from the rectum to the vagina. General instructions  Take over-the-counter and prescription medicines only as told by your health care provider.  If you were prescribed an antibiotic medicine, take or use it as told by your health care provider. Do not stop taking or using the antibiotic even if you start to feel better.  Keep all follow-up visits as told by your health care provider. This is important. How is this prevented?  Use mild, non-scented products. Do not use things that can irritate the vagina, such as fabric softeners. Avoid the following products if they are scented: ? Feminine sprays. ? Detergents. ? Tampons. ? Feminine hygiene products. ? Soaps or bubble baths.  Let air reach your genital area. ? Wear cotton underwear to reduce moisture buildup. ? Avoid wearing  underwear while you sleep. ? Avoid wearing tight pants and underwear or nylons without a cotton panel. ? Avoid wearing thong underwear.  Take off any wet clothing, such as bathing suits, as soon as possible.  Practice safe sex and use condoms. Contact a health care provider if:  You have abdominal pain.  You have a fever.  You have symptoms that last for more than 2-3 days. Get help right away if:  You have a fever and your symptoms suddenly get worse. Summary  Vaginitis is a condition in which the vaginal tissue becomes inflamed.This condition is most often caused by a change in the normal balance of bacteria and yeast that live in the vagina.  Treatment varies depending on the type of vaginitis you have.  Do not douche, use tampons , or have sex until your health care provider approves. When you can return to sex, practice safe sex and use condoms. This information is not intended to replace advice given  to you by your health care provider. Make sure you discuss any questions you have with your health care provider. Document Revised: 04/14/2017 Document Reviewed: 06/07/2016 Elsevier Patient Education  2020 [place], Adult Stress is a normal reaction to life events. Stress is what you feel when life demands more than you are used to, or more than you think you can handle. Some stress can be useful, such as studying for a test or meeting a deadline at work. Stress that occurs too often or for too long can cause problems. It can affect your emotional health and interfere with relationships and normal daily activities. Too much stress can weaken your body's defense system (immune system) and increase your risk for physical illness. If you already have a medical problem, stress can make it worse. What are the causes? All sorts of life events can cause stress. An event that causes stress for one person may not be stressful for another person. Major life events, whether  positive or negative, commonly cause stress. Examples include:  Losing a job or starting a new job.  Losing a loved one.  Moving to a new town or home.  Getting married or divorced.  Having a baby.  Getting injured or sick. Less obvious life events can also cause stress, especially if they occur day after day or in combination with each other. Examples include:  Working long hours.  Driving in traffic.  Caring for children.  Being in debt.  Being in a difficult relationship. What are the signs or symptoms? Stress can cause emotional symptoms, including:  Anxiety. This is feeling worried, afraid, on edge, overwhelmed, or out of control.  Anger, including irritation or impatience.  Depression. This is feeling sad, down, helpless, or guilty.  Trouble focusing, remembering, or making decisions. Stress can cause physical symptoms, including:  Aches and pains. These may affect your head, neck, back, stomach, or other areas of your body.  Tight muscles or a clenched jaw.  Low energy.  Trouble sleeping. Stress can cause unhealthy behaviors, including:  Eating to feel better (overeating) or skipping meals.  Working too much or putting off tasks.  Smoking, drinking alcohol, or using drugs to feel better. How is this diagnosed? Stress is diagnosed through an assessment by your health care provider. He or she may diagnose this condition based on:  Your symptoms and any stressful life events.  Your medical history.  Tests to rule out other causes of your symptoms. Depending on your condition, your health care provider may refer you to a specialist for further evaluation. How is this treated?  Stress management techniques are the recommended treatment for stress. Medicine is not typically recommended for the treatment of stress. Techniques to reduce your reaction to stressful life events include:  Stress identification. Monitor yourself for symptoms of stress and  identify what causes stress for you. These skills may help you to avoid or prepare for stressful events.  Time management. Set your priorities, keep a calendar of events, and learn to say no. Taking these actions can help you avoid making too many commitments. Techniques for coping with stress include:  Rethinking the problem. Try to think realistically about stressful events rather than ignoring them or overreacting. Try to find the positives in a stressful situation rather than focusing on the negatives.  Exercise. Physical exercise can release both physical and emotional tension. The key is to find a form of exercise that you enjoy and do it regularly.  Relaxation techniques.  These relax the body and mind. The key is to find one or more that you enjoy and use the techniques regularly. Examples include: ? Meditation, deep breathing, or progressive relaxation techniques. ? Yoga or tai chi. ? Biofeedback, mindfulness techniques, or journaling. ? Listening to music, being out in nature, or participating in other hobbies.  Practicing a healthy lifestyle. Eat a balanced diet, drink plenty of water, limit or avoid caffeine, and get plenty of sleep.  Having a strong support network. Spend time with family, friends, or other people you enjoy being around. Express your feelings and talk things over with someone you trust. Counseling or talk therapy with a mental health professional may be helpful if you are having trouble managing stress on your own. Follow these instructions at home: Lifestyle   Avoid drugs.  Do not use any products that contain nicotine or tobacco, such as cigarettes, e-cigarettes, and chewing tobacco. If you need help quitting, ask your health care provider.  Limit alcohol intake to no more than 1 drink a day for nonpregnant women and 2 drinks a day for men. One drink equals 12 oz of beer, 5 oz of wine, or 1 oz of hard liquor  Do not use alcohol or drugs to relax.  Eat a  balanced diet that includes fresh fruits and vegetables, whole grains, lean meats, fish, eggs, and beans, and low-fat dairy. Avoid processed foods and foods high in added fat, sugar, and salt.  Exercise at least 30 minutes on 5 or more days each week.  Get 7-8 hours of sleep each night. General instructions   Practice stress management techniques as discussed with your health care provider.  Drink enough fluid to keep your urine clear or pale yellow.  Take over-the-counter and prescription medicines only as told by your health care provider.  Keep all follow-up visits as told by your health care provider. This is important. Contact a health care provider if:  Your symptoms get worse.  You have new symptoms.  You feel overwhelmed by your problems and can no longer manage them on your own. Get help right away if:  You have thoughts of hurting yourself or others. If you ever feel like you may hurt yourself or others, or have thoughts about taking your own life, get help right away. You can go to your nearest emergency department or call:  Your local emergency services (911 in the [place]  A suicide crisis helpline, such as the [facility] at 607-601-1784. This is open 24 hours a day. Summary  Stress is a normal reaction to life events. It can cause problems if it happens too often or for too long.  Practicing stress management techniques is the best way to treat stress.  Counseling or talk therapy with a mental health professional may be helpful if you are having trouble managing stress on your own. This information is not intended to replace advice given to you by your health care provider. Make sure you discuss any questions you have with your health care provider. Document Revised: 11/30/2018 Document Reviewed: 06/22/2016 Elsevier Patient Education  [place].

## 2020-05-25 LAB — CERVICOVAGINAL ANCILLARY ONLY
Bacterial Vaginitis (gardnerella): POSITIVE — AB
Candida Glabrata: NEGATIVE
Candida Vaginitis: POSITIVE — AB
Chlamydia: NEGATIVE
Comment: NEGATIVE
Comment: NEGATIVE
Comment: NEGATIVE
Comment: NEGATIVE
Comment: NEGATIVE
Comment: NORMAL
Neisseria Gonorrhea: NEGATIVE
Trichomonas: NEGATIVE

## 2020-06-08 ENCOUNTER — Encounter: Payer: Self-pay | Admitting: Family Medicine

## 2020-06-12 ENCOUNTER — Telehealth: Payer: Self-pay

## 2020-06-12 NOTE — Telephone Encounter (Signed)
The patient would like a response today from the original message that she sent on Monday 06/08/2020.  Please advise

## 2020-06-12 NOTE — Telephone Encounter (Signed)
Message sen to PCP for advise

## 2020-06-15 NOTE — Telephone Encounter (Signed)
Pt should be seen.

## 2020-06-16 NOTE — Telephone Encounter (Signed)
Pt is scheduled for a f/u on 06/18/2020 at 8.30 am per Dr Salomon Fick

## 2020-06-18 ENCOUNTER — Encounter: Payer: Self-pay | Admitting: Family Medicine

## 2020-06-18 ENCOUNTER — Ambulatory Visit (INDEPENDENT_AMBULATORY_CARE_PROVIDER_SITE_OTHER): Payer: PRIVATE HEALTH INSURANCE | Admitting: Family Medicine

## 2020-06-18 ENCOUNTER — Other Ambulatory Visit: Payer: Self-pay

## 2020-06-18 ENCOUNTER — Other Ambulatory Visit (HOSPITAL_COMMUNITY)
Admission: RE | Admit: 2020-06-18 | Discharge: 2020-06-18 | Disposition: A | Discharge status: Home / Self Care | Payer: PRIVATE HEALTH INSURANCE | Source: Ambulatory Visit | Attending: Family Medicine | Admitting: Family Medicine

## 2020-06-18 VITALS — BP 106/78 | HR 72 | Temp 98.0°F | Wt 145.0 lb

## 2020-06-18 DIAGNOSIS — N761 Subacute and chronic vaginitis: Secondary | ICD-10-CM

## 2020-06-18 DIAGNOSIS — B373 Candidiasis of vulva and vagina: Secondary | ICD-10-CM | POA: Insufficient documentation

## 2020-06-18 DIAGNOSIS — N760 Acute vaginitis: Secondary | ICD-10-CM | POA: Diagnosis not present

## 2020-06-18 DIAGNOSIS — B9689 Other specified bacterial agents as the cause of diseases classified elsewhere: Secondary | ICD-10-CM | POA: Insufficient documentation

## 2020-06-18 DIAGNOSIS — Z113 Encounter for screening for infections with a predominantly sexual mode of transmission: Secondary | ICD-10-CM | POA: Diagnosis not present

## 2020-06-18 MED ORDER — METRONIDAZOLE 500 MG PO TABS: 500.0000 mg | ORAL_TABLET | Freq: Two times a day (BID) | ORAL | 0 refills | Status: AC

## 2020-06-18 MED ORDER — TRIAMCINOLONE ACETONIDE 0.025 % EX OINT
1.0000 | TOPICAL_OINTMENT | Freq: Two times a day (BID) | CUTANEOUS | 0 refills | Status: AC
Start: 2020-06-18 — End: 2020-06-23

## 2020-06-18 MED ORDER — FLUCONAZOLE 150 MG PO TABS: ORAL_TABLET | ORAL | 0 refills | Status: DC

## 2020-06-18 NOTE — Patient Instructions (Signed)
Bacterial Vaginosis  Bacterial vaginosis is an infection that occurs when the normal balance of bacteria in the vagina changes. This change is caused by an overgrowth of certain bacteria in the vagina. Bacterial vaginosis is the most common vaginal infection among females aged 24 to 44 years. This condition increases the risk of sexually transmitted infections (STIs). Treatment can help reduce this risk. Treatment is very important for pregnant women because this condition can cause babies to be born early (prematurely) or at a low birth weight. What are the causes? This condition is caused by an increase in harmful bacteria that are normally present in small amounts in the vagina. However, the exact reason this condition develops is not known. You cannot get bacterial vaginosis from toilet seats, bedding, swimming pools, or contact with objects around you. What increases the risk? The following factors may make you more likely to develop this condition:  Having a new sexual partner or multiple sexual partners, or having unprotected sex.  Douching.  Having an intrauterine device (IUD).  Smoking.  Abusing drugs and alcohol. This may lead to riskier sexual behavior.  Taking certain antibiotic medicines.  Being pregnant. What are the signs or symptoms? Some women with this condition have no symptoms. Symptoms may include:  Gray or white vaginal discharge. The discharge can be watery or foamy.  A fish-like odor with discharge, especially after sex or during menstruation.  Itching in and around the vagina.  Burning or pain with urination. How is this diagnosed? This condition is diagnosed based on:  Your medical history.  A physical exam of the vagina.  Checking a sample of vaginal fluid for harmful bacteria or abnormal cells. How is this treated? This condition is treated with antibiotic medicines. These may be given as a pill, a vaginal cream, or a medicine that is put into the  vagina (suppository). If the condition comes back after treatment, a second round of antibiotics may be needed. Follow these instructions at home: Medicines  Take or apply over-the-counter and prescription medicines only as told by your health care provider.  Take or apply your antibiotic medicine as told by your health care provider. Do not stop using the antibiotic even if you start to feel better. General instructions  If you have a female sexual partner, tell her that you have a vaginal infection. She should follow up with her health care provider. If you have a female sexual partner, he does not need treatment.  Avoid sexual activity until you finish treatment.  Drink enough fluid to keep your urine pale yellow.  Keep the area around your vagina and rectum clean. ? Wash the area daily with warm water. ? Wipe yourself from front to back after using the toilet.  If you are breastfeeding, talk to your health care provider about continuing breastfeeding during treatment.  Keep all follow-up visits. This is important. How is this prevented? Self-care  Do not douche.  Wash the outside of your vagina with warm water only.  Wear cotton or cotton-lined underwear.  Avoid wearing tight pants and pantyhose, especially during the summer. Safe sex  Use protection when having sex. This includes: ? Using condoms. ? Using dental dams. This is a thin layer of a material made of latex or polyurethane that protects the mouth during oral sex.  Limit the number of sexual partners. To help prevent bacterial vaginosis, it is best to have sex with just one partner (monogamous relationship).  Make sure you and your sexual partner   are tested for STIs. Drugs and alcohol  Do not use any products that contain nicotine or tobacco. These products include cigarettes, chewing tobacco, and vaping devices, such as e-cigarettes. If you need help quitting, ask your health care provider.  Do not use  drugs.  Do not drink alcohol if: ? Your health care provider tells you not to do this. ? You are pregnant, may be pregnant, or are planning to become pregnant.  If you drink alcohol: ? Limit how much you have to 0-1 drink a day. ? Be aware of how much alcohol is in your drink. In the [place] one drink equals one 12 oz bottle of beer (355 mL), one 5 oz glass of wine (148 mL), or one 1 oz glass of hard liquor (44 mL). Where to find more information  Centers for Disease Control and Prevention: www.cdc.gov  American Sexual Health Association (ASHA): www.ashastd.org  [place] Department of Health and Human Services, Office on Women's Health: www.womenshealth.gov Contact a health care provider if:  Your symptoms do not improve, even after treatment.  You have more discharge or pain when urinating.  You have a fever or chills.  You have pain in your abdomen or pelvis.  You have pain during sex.  You have vaginal bleeding between menstrual periods. Summary  Bacterial vaginosis is a vaginal infection that occurs when the normal balance of bacteria in the vagina changes. It results from an overgrowth of certain bacteria.  This condition increases the risk of sexually transmitted infections (STIs). Getting treated can help reduce this risk.  Treatment is very important for pregnant women because this condition can cause babies to be born early (prematurely) or at low birth weight.  This condition is treated with antibiotic medicines. These may be given as a pill, a vaginal cream, or a medicine that is put into the vagina (suppository). This information is not intended to replace advice given to you by your health care provider. Make sure you discuss any questions you have with your health care provider. Document Revised: 10/31/2019 Document Reviewed: 10/31/2019 Elsevier Patient Education  2021 [employer] Inc.  

## 2020-06-18 NOTE — Progress Notes (Signed)
Subjective:    Patient ID: Peggy Becker, female    DOB: 01/07/1996, 25 y.o.   MRN: [id number]  No chief complaint on file.   HPI Patient was seen today for ongoing concern. Pt with continued vaginal discharge and irritation.  Seen on 05/21/2020 for similar symptoms.  Treated with Flagyl and Diflucan but symptoms continued.  Endorses thin whitish d/c. LMP ended 06/17/2020.  Denies changes in soaps, lotions, detergents, foods, or recent intercourse.  Using Dove antibacterial soap on body not in vaginal area.   Past Medical History:  Diagnosis Date  . Medical history non-contributory     No Known Allergies  ROS General: Denies fever, chills, night sweats, changes in weight, changes in appetite HEENT: Denies headaches, ear pain, changes in vision, rhinorrhea, sore throat CV: Denies CP, palpitations, SOB, orthopnea Pulm: Denies SOB, cough, wheezing GI: Denies abdominal pain, nausea, vomiting, diarrhea, constipation GU: Denies dysuria, hematuria, frequency +vaginal discharge Msk: Denies muscle cramps, joint pains Neuro: Denies weakness, numbness, tingling Skin: Denies rashes, bruising Psych: Denies depression, anxiety, hallucinations     Objective:    Blood pressure 106/78, pulse 72, temperature 98 F (36.7 C), temperature source Oral, weight 145 lb (65.8 kg), SpO2 99 %, not currently breastfeeding.  Gen. Pleasant, well-nourished, in no distress, normal affect   HEENT: [place]/AT, face symmetric, conjunctiva clear, no scleral icterus, PERRLA, EOMI, nares patent without drainage Lungs: no accessory muscle use Cardiovascular: RRR, no peripheral edema GU: external female genitalia with erythema, excoriation, and moderate edema.  No vesicles noted.  Normal urethral meatus, perineum, and anus.  Aptima swab and wet prep swab obtained. Speculum exam deferred. Musculoskeletal: No deformities, no cyanosis or clubbing, normal tone Neuro:  A&Ox3, CN II-XII intact, normal gait Skin:  Warm, no  lesions/ rash   Wt Readings from Last 3 Encounters:  06/18/20 145 lb (65.8 kg)  05/21/20 147 lb 9.6 oz (67 kg)  02/27/20 140 lb (63.5 kg)    Lab Results  Component Value Date   WBC 5.7 02/27/2020   HGB 12.5 02/27/2020   HCT 37.8 02/27/2020   PLT 326 02/27/2020   GLUCOSE 94 02/27/2020   NA 139 02/27/2020   K 3.9 02/27/2020   CL 104 02/27/2020   CREATININE 0.56 02/27/2020   BUN 8 02/27/2020   CO2 28 02/27/2020   TSH 1.02 02/27/2020    Assessment/Plan:  Subacute vaginitis  -will treat for BV.  Will also send in rx for diflucan 2/2 h/o abx induced yeast infection -triamcinolone ointment externally sparingly prn.  Consider A&D ointment to create a barrier on skin and reduce irritation. -given handout - Plan: Cervicovaginal ancillary only, metroNIDAZOLE (FLAGYL) 500 MG tablet, Diflucan 100 mg, triamcinolone ointment  BV (bacterial vaginosis)  -clue cells noted on wet prep - Plan: metroNIDAZOLE (FLAGYL) 500 MG tablet  F/u prn  Abbe Amsterdam, MD

## 2020-06-19 LAB — CERVICOVAGINAL ANCILLARY ONLY
Bacterial Vaginitis (gardnerella): POSITIVE — AB
Candida Glabrata: NEGATIVE
Candida Vaginitis: POSITIVE — AB
Chlamydia: NEGATIVE
Comment: NEGATIVE
Comment: NEGATIVE
Comment: NEGATIVE
Comment: NEGATIVE
Comment: NEGATIVE
Comment: NORMAL
Neisseria Gonorrhea: NEGATIVE
Trichomonas: NEGATIVE

## 2021-02-25 IMAGING — US US MFM OB DETAIL+14 WK
1 series · 13 of 28 positions shown · non-contrast
Comparison: none

[Series 1: us mfm ob detail+14 wk · 13 of 125 slices shown]
[im 5/125]
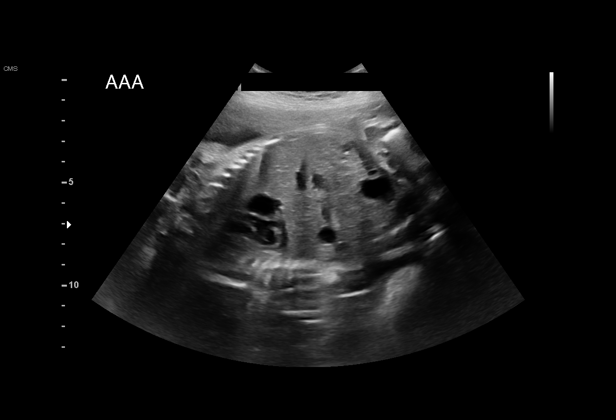
[im 14/125]
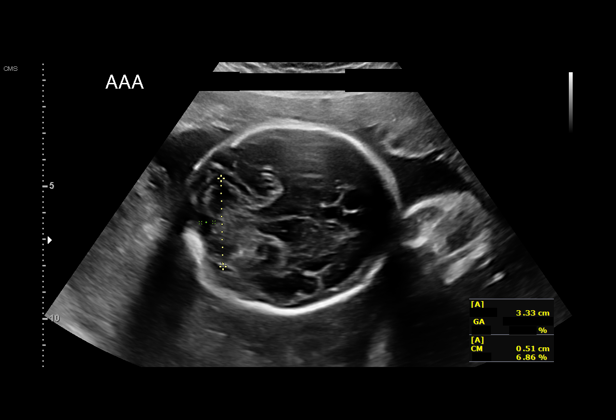
[im 23/125]
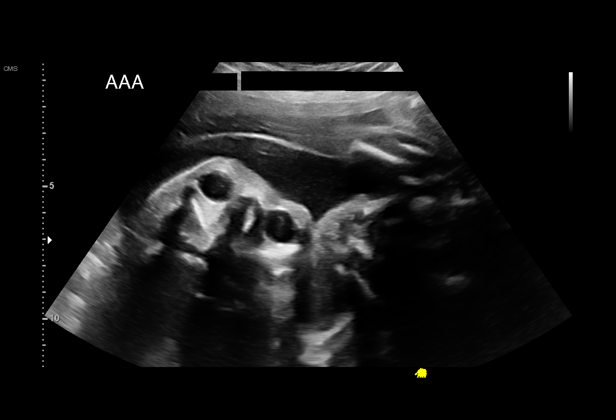
[im 33/125]
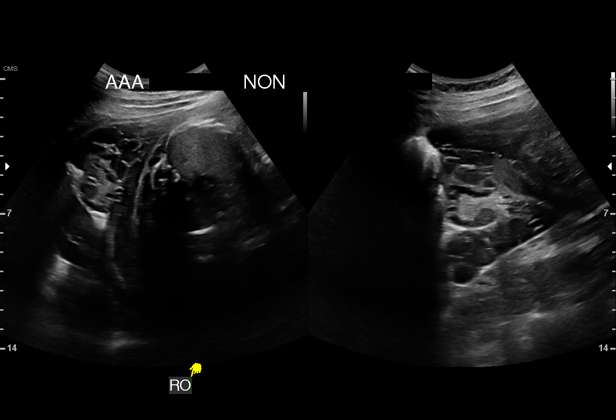
[im 42/125]
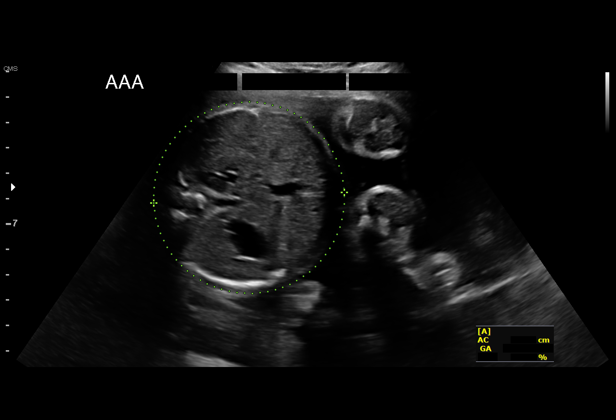
[im 51/125]
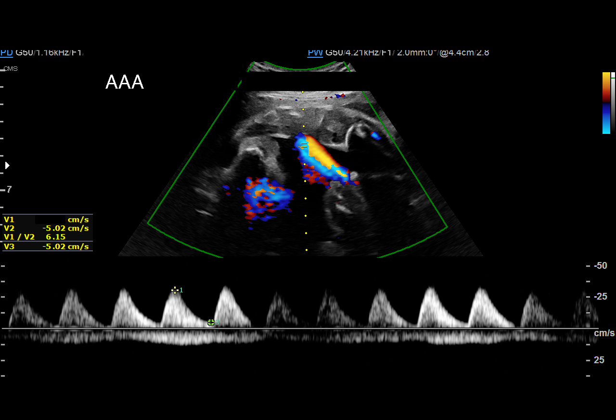
[im 65/125]
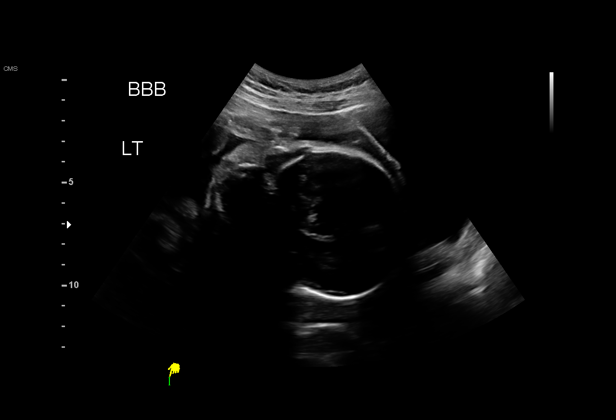
[im 74/125]
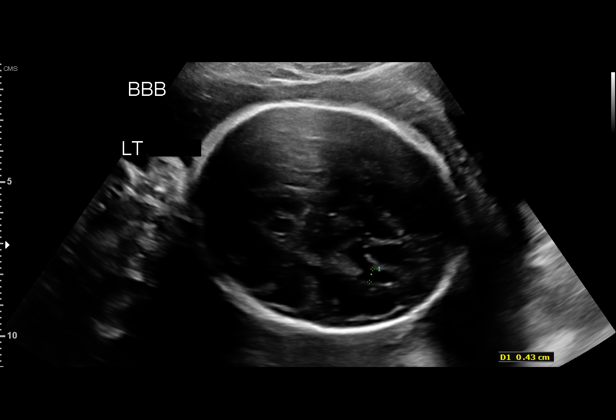
[im 83/125]
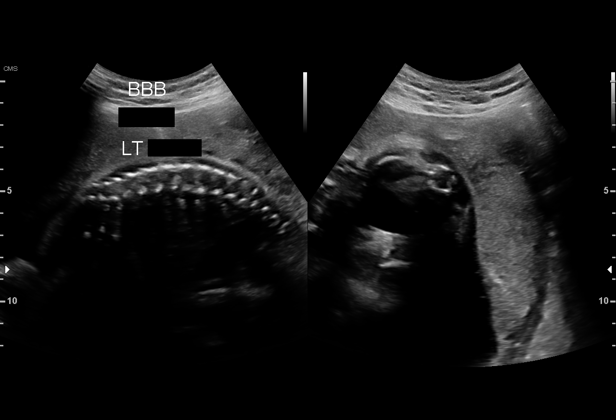
[im 92/125]
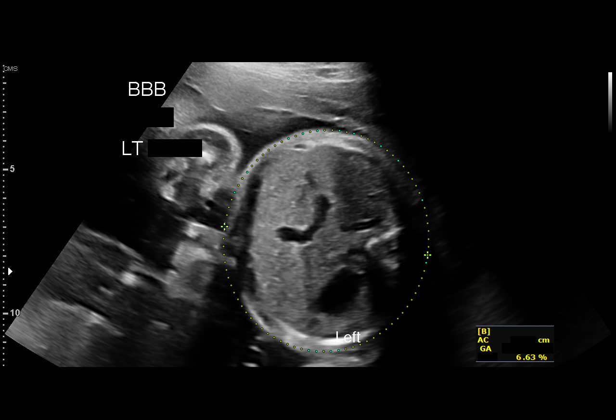
[im 102/125]
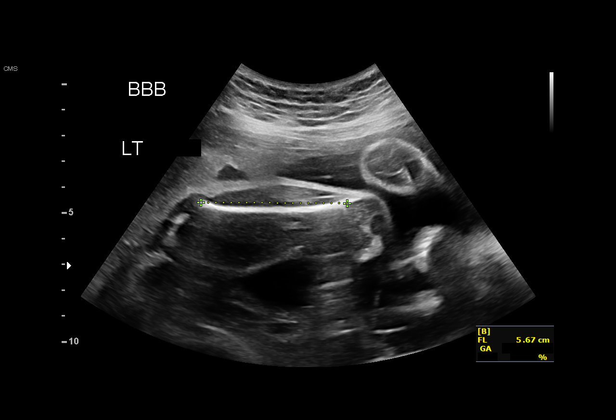
[im 111/125]
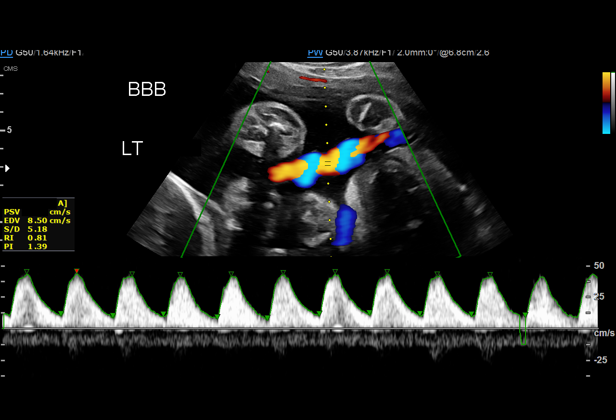
[im 120/125]
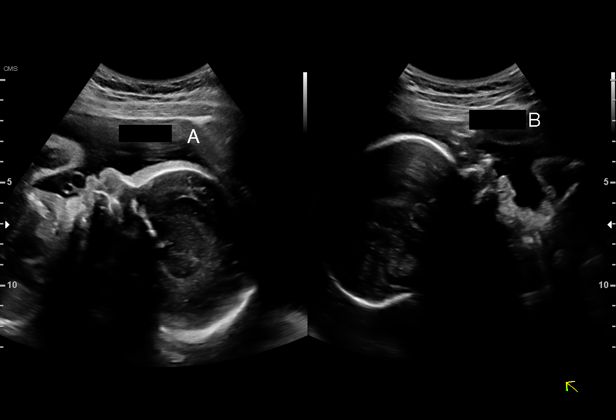

[13 of 28 positions shown; findings below may reference images not displayed]

Obstetrics &
                                                            Gynecology
                                                            [address]
                                                            [place].
                   CNM

  2  US MFM OB DETAIL ADDL GEST           76811.02     KUN HIGH
     +14 WK
  3  US MFM UA ADDL GEST                  76820.01     KUN HIGH
  4  US MFM UA CORD DOPPLER               76820.02     KUN HIGH
 ----------------------------------------------------------------------

 ----------------------------------------------------------------------
Indications

  Maternal care for known or suspected poor
  fetal growth, third trimester, fetus 1 IUGR
  Twin pregnancy, di/di, third trimester
  29 weeks gestation of pregnancy
  Antenatal screening for malformations
  Maternal care for known or suspected poor
  fetal growth, third trimester, fetus 2 IUGR
 ----------------------------------------------------------------------
Fetal Evaluation (Fetus A)

 Num Of Fetuses:         2
 Fetal Heart Rate(bpm):  149
 Cardiac Activity:       Observed
 Fetal Lie:              Maternal right, NON PRESENTING
 Presentation:           Breech
 Placenta:               Right lateral
 P. Cord Insertion:      Visualized
 Membrane Desc:      Dividing Membrane seen - Dichorionic.
 Amniotic Fluid
 AFI FV:      Within normal limits

                             Largest Pocket(cm)

Biometry (Fetus A)

 BPD:      71.7  mm     G. Age:  28w 5d         26  %    CI:         76.2   %    70 - 86
                                                         FL/HC:      20.1   %    19.6 -
 HC:      260.3  mm     G. Age:  28w 2d          5  %    HC/AC:      1.08        0.99 -
 AC:      241.1  mm     G. Age:  28w 3d         22  %    FL/BPD:     72.9   %    71 - 87
 FL:       52.3  mm     G. Age:  27w 6d          8  %    FL/AC:      21.7   %    20 - 24
 HUM:      45.5  mm     G. Age:  26w 6d        < 5  %
 CER:      33.3  mm     G. Age:  29w 1d         50  %

 CM:        5.1  mm

 Est. FW:    2214  gm    2 lb 10 oz      12  %     FW Discordancy         5  %
OB History

 Gravidity:    2         Term:   0        Prem:   0        SAB:   1
 TOP:          0       Ectopic:  0        Living: 0
Gestational Age (Fetus A)

 LMP:           29w 1d        Date:  12/06/18                 EDD:   09/12/19
 U/S Today:     28w 2d                                        EDD:   09/18/19
 Best:          29w 1d     Det. By:  LMP  (12/06/18)          EDD:   09/12/19
Anatomy (Fetus A)

 Cranium:               Appears normal         Aortic Arch:            Appears normal
 Cavum:                 Appears normal         Ductal Arch:            Appears normal
 Ventricles:            Appears normal         Diaphragm:              Appears normal
 Choroid Plexus:        Appears normal         Stomach:                Appears normal, left
                                                                       sided
 Cerebellum:            Appears normal         Abdomen:                Appears normal
 Posterior Fossa:       Appears normal         Abdominal Wall:         Not well visualized
 Nuchal Fold:           Not applicable (>20    Cord Vessels:           Appears normal (3
                        wks GA)                                        vessel cord)
 Face:                  Appears normal         Kidneys:                Appear normal
                        (orbits and profile)
 Lips:                  Appears normal         Bladder:                Appears normal
 Thoracic:              Appears normal         Spine:                  Limited views
                                                                       appear normal
 Heart:                 Not well visualized    Upper Extremities:      LUE not well
                                                                       visualized
 RVOT:                  Appears normal         Lower Extremities:      Visualized
 LVOT:                  Appears normal
Doppler - Fetal Vessels (Fetus A)

 Umbilical Artery
  S/D     %tile                                            ADFV    RDFV
 6.15    > 97.5                                               No      No

Fetal Evaluation (Fetus B)
 Num Of Fetuses:         2
 Fetal Heart Rate(bpm):  146
 Cardiac Activity:       Observed
 Fetal Lie:              Maternal Left, PRESENTING
 Presentation:           Cephalic
 Placenta:               Left lateral
 P. Cord Insertion:      Visualized
 Membrane Desc:      Dividing Membrane seen - Dichorionic.

 Amniotic Fluid
 AFI FV:      Within normal limits

                             Largest Pocket(cm)

Biometry (Fetus B)

 BPD:      70.4  mm     G. Age:  28w 2d         14  %    CI:        70.32   %    70 - 86
                                                         FL/HC:      21.1   %    19.6 -
 HC:      267.7  mm     G. Age:  29w 1d         18  %    HC/AC:      1.14        0.99 -
 AC:      234.6  mm     G. Age:  27w 6d         10  %    FL/BPD:     80.3   %    71 - 87
 FL:       56.5  mm     G. Age:  29w 5d         50  %    FL/AC:      24.1   %    20 - 24
 HUM:      48.1  mm     G. Age:  28w 1d         27  %
 CER:        36  mm     G. Age:  31w 0d         81  %

 Est. FW:    0244  gm    2 lb 12 oz      20  %     FW Discordancy      0 \ 5 %
Gestational Age (Fetus B)

 LMP:           29w 1d        Date:  12/06/18                 EDD:   09/12/19
 U/S Today:     28w 5d                                        EDD:   09/15/19
 Best:          29w 1d     Det. By:  LMP  (12/06/18)          EDD:   09/12/19
Anatomy (Fetus B)

 Cranium:               Appears normal         Aortic Arch:            Not well visualized
 Cavum:                 Appears normal         Ductal Arch:            Not well visualized
 Ventricles:            Appears normal         Diaphragm:              Appears normal
 Choroid Plexus:        Appears normal         Stomach:                Appears normal, left
                                                                       sided
 Cerebellum:            Appears normal         Abdomen:                Appears normal
 Posterior Fossa:       Appears normal         Abdominal Wall:         Appears nml (cord
                                                                       insert, abd wall)
 Nuchal Fold:           Not applicable (>20    Cord Vessels:           Appears normal (3
                        wks GA)                                        vessel cord)
 Face:                  Appears normal         Kidneys:                Appear normal
                        (orbits and profile)
 Lips:                  Appears normal         Bladder:                Appears normal
 Thoracic:              Appears normal         Spine:                  Appears normal
 Heart:                 Not well visualized    Upper Extremities:      LUE not well
                                                                       visualized
 RVOT:                  Appears normal         Lower Extremities:      Visualized
 LVOT:                  Appears normal
Doppler - Fetal Vessels (Fetus B)

 Umbilical Artery
  S/D     %tile                                            ADFV    RDFV
  4.3       96                                                No      No
Cervix Uterus Adnexa

 Cervix
 Normal appearance by transabdominal scan.

 Uterus
 No abnormality visualized.

 Left Ovary
 Not visualized.

 Right Ovary
 Within normal limits.

 Cul De Sac
 No free fluid seen.

 Adnexa
 No abnormality visualized.
Comments

 This patient was seen in consultation today due to possible
 fetal growth restriction in a dichorionic twin gestation.  This is
 a spontaneously conceived twin gestation.  The patient
 denies any significant past medical history and denies any
 other problems in her current pregnancy.
 On today's exam, the overall EFW for twin A measured the
 12th percentile for her gestational age.  The EFW for twin B
 measured at the 20th percentile for her gestational age.
 There was normal amniotic fluid noted around both twin A
 and twin B today.
 A 5% discordance was noted in the overall weights obtained
 for both twin A and twin B.
 Doppler studies of the umbilical arteries performed for twin A
 and twin B elevated S/D ratios, although forward diastolic flow
 continues to be noted in both fetuses.  There were no signs
 of absent or reversed end-diastolic flow noted in either fetus.
 The views of the fetal anatomy's for both fetuses were limited
 due to her advanced gestational age.
 The management of dichorionic twins was discussed.  She
 was advised that management of twin pregnancies will
 involve frequent ultrasound exams to assess the fetal growth
 and amniotic fluid levels.
 As the overall EFW's for both fetuses measure in the lower
 normal range and due to the elevated umbilical artery
 Doppler studies noted in both fetuses today, we will continue
 to follow her with weekly umbilical artery Doppler studies.
 We will also start weekly fetal testing with nonstress tests
 next week.
 Should absent or reversed end-diastolic flow be noted in the
 umbilical arteries during her future exams, a course of
 antenatal corticosteroids should be administered at that time.
 A course of antenatal corticosteroids would also be indicated
 should the overall EFW's be less than the 10th percentile
 during her future exams and she is at risk for a preterm
 delivery.
 The patient was advised that delivery for uncomplicated
 dichorionic twins is usually recommended at around 38
 weeks.  However should growth restriction be noted in a twin
 gestation with normal umbilical artery Doppler studies,
 delivery at around 36 to 37 weeks may be recommended.
 Delivery at between 32 to 34 weeks may be recommended if
 fetal growth restriction is noted with either absent or reversed
 end-diastolic flow.
 A follow-up exam was scheduled in one week for a nonstress
 test and umbilical artery Doppler studies.
 At the end of the consultation, the patient stated that all her
 questions have been answered to her complete satisfaction.
 Thank you for referring this patient for a Maternal-Fetal
 Medicine consultation.
 A total of 32 minutes was spent counseling and coordinating
 the care for this patient.  Greater than 50% of the time was
 spent in direct face-to-face contact.

## 2023-06-14 ENCOUNTER — Encounter: Payer: Self-pay | Admitting: Family Medicine

## 2023-06-14 ENCOUNTER — Ambulatory Visit (INDEPENDENT_AMBULATORY_CARE_PROVIDER_SITE_OTHER): Payer: No Typology Code available for payment source | Admitting: Family Medicine

## 2023-06-14 VITALS — BP 100/70 | HR 78 | Temp 98.1°F | Ht 62.0 in | Wt 150.2 lb

## 2023-06-14 DIAGNOSIS — E049 Nontoxic goiter, unspecified: Secondary | ICD-10-CM

## 2023-06-14 DIAGNOSIS — Z1159 Encounter for screening for other viral diseases: Secondary | ICD-10-CM | POA: Diagnosis not present

## 2023-06-14 DIAGNOSIS — Z0000 Encounter for general adult medical examination without abnormal findings: Secondary | ICD-10-CM | POA: Diagnosis not present

## 2023-06-14 DIAGNOSIS — R5383 Other fatigue: Secondary | ICD-10-CM | POA: Diagnosis not present

## 2023-06-14 DIAGNOSIS — E559 Vitamin D deficiency, unspecified: Secondary | ICD-10-CM

## 2023-06-14 LAB — CBC WITH DIFFERENTIAL/PLATELET
Basophils Absolute: 0 10*3/uL (ref 0.0–0.1)
Basophils Relative: 0.9 % (ref 0.0–3.0)
Eosinophils Absolute: 0.1 10*3/uL (ref 0.0–0.7)
Eosinophils Relative: 2.9 % (ref 0.0–5.0)
HCT: 39 % (ref 36.0–46.0)
Hemoglobin: 12.9 g/dL (ref 12.0–15.0)
Lymphocytes Relative: 44.5 % (ref 12.0–46.0)
Lymphs Abs: 1.7 10*3/uL (ref 0.7–4.0)
MCHC: 33.1 g/dL (ref 30.0–36.0)
MCV: 90.3 fL (ref 78.0–100.0)
Monocytes Absolute: 0.4 10*3/uL (ref 0.1–1.0)
Monocytes Relative: 10.7 % (ref 3.0–12.0)
Neutro Abs: 1.5 10*3/uL (ref 1.4–7.7)
Neutrophils Relative %: 41 % — ABNORMAL LOW (ref 43.0–77.0)
Platelets: 268 10*3/uL (ref 150.0–400.0)
RBC: 4.32 Mil/uL (ref 3.87–5.11)
RDW: 14 % (ref 11.5–15.5)
WBC: 3.7 10*3/uL — ABNORMAL LOW (ref 4.0–10.5)

## 2023-06-14 LAB — COMPREHENSIVE METABOLIC PANEL
ALT: 16 U/L (ref 0–35)
AST: 15 U/L (ref 0–37)
Albumin: 4.4 g/dL (ref 3.5–5.2)
Alkaline Phosphatase: 56 U/L (ref 39–117)
BUN: 9 mg/dL (ref 6–23)
CO2: 28 meq/L (ref 19–32)
Calcium: 9.2 mg/dL (ref 8.4–10.5)
Chloride: 104 meq/L (ref 96–112)
Creatinine, Ser: 0.71 mg/dL (ref 0.40–1.20)
GFR: 117.26 mL/min (ref >60.00)
Glucose, Bld: 76 mg/dL (ref 70–99)
Potassium: 4.4 meq/L (ref 3.5–5.1)
Sodium: 137 meq/L (ref 135–145)
Total Bilirubin: 0.8 mg/dL (ref 0.2–1.2)
Total Protein: 7.2 g/dL (ref 6.0–8.3)

## 2023-06-14 LAB — LIPID PANEL
Cholesterol: 164 mg/dL (ref 0–200)
HDL: 48.9 mg/dL (ref >39.00)
LDL Cholesterol: 106 mg/dL — ABNORMAL HIGH (ref 0–99)
NonHDL: 114.76
Total CHOL/HDL Ratio: 3
Triglycerides: 44 mg/dL (ref 0.0–149.0)
VLDL: 8.8 mg/dL (ref 0.0–40.0)

## 2023-06-14 LAB — T4, FREE: Free T4: 0.7 ng/dL (ref 0.60–1.60)

## 2023-06-14 LAB — VITAMIN B12: Vitamin B-12: 595 pg/mL (ref 211–911)

## 2023-06-14 LAB — HEMOGLOBIN A1C: Hgb A1c MFr Bld: 5.7 % (ref 4.6–6.5)

## 2023-06-14 LAB — TSH: TSH: 1.45 u[IU]/mL (ref 0.35–5.50)

## 2023-06-14 LAB — VITAMIN D 25 HYDROXY (VIT D DEFICIENCY, FRACTURES): VITD: 16.6 ng/mL — ABNORMAL LOW (ref 30.00–100.00)

## 2023-06-14 NOTE — Progress Notes (Signed)
Established Patient Office Visit   Subjective  Patient ID: Peggy Becker, female    DOB: 09-May-1996  Age: 28 y.o. MRN: [id number]  Chief Complaint  Patient presents with   Annual Exam    Patient is a 28 year old female lost to follow-up, last seen 2022, who presents for CPE.  Patient states she has been doing well overall, staying busy with 2 kids and work.  Patient has noticed some decrease in energy despite taking vitamin D OTC 2000 IUs.  Vitamin D level on 10/2022 was 26.6.  Patient followed by [facility] for Pap, last done 2021.  Patient had flu vaccine 03/24/2023 and Tdap 07/12/2019.    Patient Active Problem List   Diagnosis Date Noted   Twin gestation in third trimester - [name] 07/23/2019   Delivery by emergency cesarean 3/9 - abruption TwinB 32.5 wks 07/23/2019   Postpartum care following cesarean delivery - 3/9 07/23/2019   Placental abruption affecting delivery 07/23/2019   Past Medical History:  Diagnosis Date   Medical history non-contributory    Past Surgical History:  Procedure Laterality Date   CESAREAN SECTION N/A 07/23/2019   Procedure: CESAREAN SECTION;  Surgeon: Lazaro Arms, MD;  Location: [facility] LD ORS;  Service: Obstetrics;  Laterality: N/A;   NO PAST SURGERIES     Social History   Tobacco Use   Smoking status: Never   Smokeless tobacco: Former  Building services engineer status: Never Used  Substance Use Topics   Alcohol use: No   Drug use: No    Types: Marijuana   History reviewed. No pertinent family history. No Known Allergies    ROS Negative unless stated above    Objective:     BP 100/70 (BP Location: Left Arm, Patient Position: Sitting, Cuff Size: Normal)   Pulse 78   Temp 98.1 F (36.7 C) (Oral)   Ht 5\' 2"  (1.575 m)   Wt 150 lb 3.2 oz (68.1 kg)   LMP 06/09/2023 (Approximate)   SpO2 97%   BMI 27.47 kg/m  BP Readings from Last 3 Encounters:  06/14/23 100/70  06/18/20 106/78  05/21/20 110/78   Wt Readings from Last 3  Encounters:  06/14/23 150 lb 3.2 oz (68.1 kg)  06/18/20 145 lb (65.8 kg)  05/21/20 147 lb 9.6 oz (67 kg)      Physical Exam Constitutional:      Appearance: Normal appearance.  HENT:     Head: Normocephalic and atraumatic.     Right Ear: Tympanic membrane, ear canal and external ear normal.     Left Ear: Tympanic membrane, ear canal and external ear normal.     Nose: Nose normal.     Mouth/Throat:     Mouth: Mucous membranes are moist.     Pharynx: No oropharyngeal exudate or posterior oropharyngeal erythema.  Eyes:     General: No scleral icterus.    Extraocular Movements: Extraocular movements intact.     Conjunctiva/sclera: Conjunctivae normal.     Pupils: Pupils are equal, round, and reactive to light.  Neck:     Thyroid: Thyromegaly present.     Comments: Mild goiter, without nodules, tenderness Cardiovascular:     Rate and Rhythm: Normal rate and regular rhythm.     Pulses: Normal pulses.     Heart sounds: Normal heart sounds. No murmur heard.    No friction rub.  Pulmonary:     Effort: Pulmonary effort is normal.     Breath sounds: Normal breath  sounds. No wheezing, rhonchi or rales.  Abdominal:     General: Bowel sounds are normal.     Palpations: Abdomen is soft.     Tenderness: There is no abdominal tenderness.  Musculoskeletal:        General: No deformity. Normal range of motion.  Lymphadenopathy:     Cervical: No cervical adenopathy.  Skin:    General: Skin is warm and dry.     Findings: No lesion.  Neurological:     General: No focal deficit present.     Mental Status: She is alert and oriented to person, place, and time.  Psychiatric:        Mood and Affect: Mood normal.        Thought Content: Thought content normal.       06/14/2023    9:13 AM 02/28/2020    2:41 PM 06/28/2017    3:49 PM  Depression screen PHQ 2/9  Decreased Interest 0 1 0  Down, Depressed, Hopeless 0 1 0  PHQ - 2 Score 0 2 0  Altered sleeping 2 1   Tired, decreased energy  2 2   Change in appetite 0 0   Feeling bad or failure about yourself  0 0   Trouble concentrating 0 0   Moving slowly or fidgety/restless 0 0   Suicidal thoughts 0 0   PHQ-9 Score 4 5   Difficult doing work/chores Somewhat difficult Somewhat difficult       06/14/2023    9:13 AM 02/28/2020    2:41 PM  GAD 7 : Generalized Anxiety Score  Nervous, Anxious, on Edge 0 3  Control/stop worrying 1 3  Worry too much - different things 1 3  Trouble relaxing 0 2  Restless 0 0  Easily annoyed or irritable 1 3  Afraid - awful might happen 0 0  Total GAD 7 Score 3 14  Anxiety Difficulty Somewhat difficult Somewhat difficult      No results found for any visits on 06/14/23.    Assessment & Plan:  Well adult exam -Age-appropriate health screenings discussed -Obtain labs -Immunizations reviewed and up-to-date -Pap done with OB/GYN -Mammogram and colonoscopy not yet indicated 2/2 age -Next CPE in 1 year -     CBC with Differential/Platelet; Future -     Comprehensive metabolic panel; Future -     Hemoglobin A1c; Future -     Lipid panel; Future  Goiter -Asymptomatic -Mild thyromegaly on exam.  Obtain labs.  Further conditions such as monitor versus ultrasound based on imaging. -     TSH; Future -     T4, free; Future  Vitamin D deficiency -Vitamin D level 26.6 on 10/2022 -     VITAMIN D 25 Hydroxy (Vit-D Deficiency, Fractures); Future  Encounter for hepatitis C screening test for low risk patient -     Hepatitis C antibody  Fatigue, unspecified type -Discussed possible causes of fatigue including normal fatigue due to work and raising children, thyroid dysfunction, electrolyte abnormality, DM 2, etc. -Obtain labs -Increase physical activity such as exercising and other lifestyle modifications suggested. -     CBC with Differential/Platelet; Future -     Comprehensive metabolic panel; Future -     Hemoglobin A1c; Future -     TSH; Future -     Vitamin B12; Future -      VITAMIN D 25 Hydroxy (Vit-D Deficiency, Fractures); Future -     T4, free; Future    Return in  about 6 weeks (around 07/26/2023), or For continued or worsening symptoms, for physical in 1 year.   Deeann Saint, MD

## 2023-06-15 LAB — HEPATITIS C ANTIBODY: Hepatitis C Ab: NONREACTIVE

## 2023-06-21 ENCOUNTER — Encounter: Payer: Self-pay | Admitting: Family Medicine

## 2023-06-21 ENCOUNTER — Other Ambulatory Visit: Payer: Self-pay | Admitting: Family Medicine

## 2023-06-21 DIAGNOSIS — E559 Vitamin D deficiency, unspecified: Secondary | ICD-10-CM

## 2023-06-21 MED ORDER — VITAMIN D (ERGOCALCIFEROL) 1.25 MG (50000 UNIT) PO CAPS: 50000.0000 [IU] | ORAL_CAPSULE | ORAL | 0 refills | Status: DC

## 2024-01-04 ENCOUNTER — Encounter: Payer: Self-pay | Admitting: Family Medicine

## 2024-01-04 ENCOUNTER — Ambulatory Visit: Admitting: Family Medicine

## 2024-01-04 VITALS — BP 98/64 | HR 79 | Temp 97.9°F | Ht 62.0 in | Wt 136.8 lb

## 2024-01-04 DIAGNOSIS — B9689 Other specified bacterial agents as the cause of diseases classified elsewhere: Secondary | ICD-10-CM

## 2024-01-04 DIAGNOSIS — N761 Subacute and chronic vaginitis: Secondary | ICD-10-CM | POA: Diagnosis not present

## 2024-01-04 DIAGNOSIS — B3731 Acute candidiasis of vulva and vagina: Secondary | ICD-10-CM

## 2024-01-04 DIAGNOSIS — N898 Other specified noninflammatory disorders of vagina: Secondary | ICD-10-CM

## 2024-01-04 DIAGNOSIS — N760 Acute vaginitis: Secondary | ICD-10-CM | POA: Diagnosis not present

## 2024-01-04 MED ORDER — METRONIDAZOLE 500 MG PO TABS: 500.0000 mg | ORAL_TABLET | Freq: Three times a day (TID) | ORAL | 0 refills | Status: AC

## 2024-01-04 MED ORDER — FLUCONAZOLE 150 MG PO TABS: 150.0000 mg | ORAL_TABLET | Freq: Once | ORAL | 0 refills | Status: AC

## 2024-01-04 NOTE — Progress Notes (Signed)
 Established Patient Office Visit   Subjective  Patient ID: Peggy Becker, female    DOB: [birth year]  Age: 28 y.o. MRN: [id number]  Chief Complaint  Patient presents with   Acute Visit    Patient is having BV symptoms, odor and discharge, started  back 6 days ago after having her menses     Pt is a 28 yo female seen for ongoing concern.  Pt notes recent dx of BV at OB/Gyn office.  Treated with flagyl , but notes return of sx.  Typically occurs after sex with this current partner.  Having thick white d/c, odor, and irritation x 4 days after menses stopped.  Declines STI testing.  Requesting wet prep instead of Aptima swab due to cost.    Patient Active Problem List   Diagnosis Date Noted   Twin gestation in third trimester - [name] 07/23/2019   Delivery by emergency cesarean 3/9 - abruption TwinB 32.5 wks 07/23/2019   Postpartum care following cesarean delivery - 3/9 07/23/2019   Placental abruption affecting delivery 07/23/2019   Past Medical History:  Diagnosis Date   Medical history non-contributory    Past Surgical History:  Procedure Laterality Date   CESAREAN SECTION N/A 07/23/2019   Procedure: CESAREAN SECTION;  Surgeon: Jayne Vonn DEL, MD;  Location: [facility] LD [facility]S;  Service: Obstetrics;  Laterality: N/A;   NO PAST SURGERIES     Social History   Tobacco Use   Smoking status: Never   Smokeless tobacco: Former  Building services engineer status: Never Used  Substance Use Topics   Alcohol use: No   Drug use: No    Types: Marijuana   History reviewed. No pertinent family history. No Known Allergies  ROS Negative unless stated above    Objective:     BP 98/64 (BP Location: Left Arm, Patient Position: Sitting, Cuff Size: Normal)   Pulse 79   Temp 97.9 F (36.6 C) (Oral)   Ht 5' 2 (1.575 m)   Wt 136 lb 12.8 oz (62.1 kg)   LMP 12/24/2023 (Exact Date)   SpO2 98%   BMI 25.02 kg/m  BP Readings from Last 3 Encounters:  01/04/24 98/64  06/14/23 100/70  06/18/20  106/78   Wt Readings from Last 3 Encounters:  01/04/24 136 lb 12.8 oz (62.1 kg)  06/14/23 150 lb 3.2 oz (68.1 kg)  06/18/20 145 lb (65.8 kg)      Physical Exam Constitutional:      General: She is not in acute distress.    Appearance: Normal appearance.  HENT:     Head: Normocephalic and atraumatic.     Nose: Nose normal.     Mouth/Throat:     Mouth: Mucous membranes are moist.  Cardiovascular:     Rate and Rhythm: Normal rate and regular rhythm.     Heart sounds: Normal heart sounds.  Pulmonary:     Effort: Pulmonary effort is normal.     Breath sounds: Normal breath sounds.  Genitourinary:    Comments: Self swab collected.  Wet prep obtained branched linear structures and clue cells noted. Skin:    General: Skin is warm and dry.  Neurological:     Mental Status: She is alert and oriented to person, place, and time.        01/04/2024    1:34 PM 06/14/2023    9:13 AM 02/28/2020    2:41 PM  Depression screen PHQ 2/9  Decreased Interest 0 0 1  Down, Depressed, Hopeless  0 0 1  PHQ - 2 Score 0 0 2  Altered sleeping 0 2 1  Tired, decreased energy 3 2 2   Change in appetite  0 0  Feeling bad or failure about yourself  0 0 0  Trouble concentrating 0 0 0  Moving slowly or fidgety/restless 0 0 0  Suicidal thoughts 0 0 0  PHQ-9 Score 3 4 5   Difficult doing work/chores Somewhat difficult Somewhat difficult Somewhat difficult      01/04/2024    1:35 PM 06/14/2023    9:13 AM 02/28/2020    2:41 PM  GAD 7 : Generalized Anxiety Score  Nervous, Anxious, on Edge 0 0 3  Control/stop worrying 0 1 3  Worry too much - different things 0 1 3  Trouble relaxing 0 0 2  Restless 0 0 0  Easily annoyed or irritable 0 1 3  Afraid - awful might happen 0 0 0  Total GAD 7 Score 0 3 14  Anxiety Difficulty Not difficult at all Somewhat difficult Somewhat difficult     No results found for any visits on 01/04/24.    Assessment & Plan:   Subacute vaginitis  Vaginal discharge  BV  (bacterial vaginosis) -     metroNIDAZOLE ; Take 1 tablet (500 mg total) by mouth 3 (three) times daily for 7 days.  Dispense: 21 tablet; Refill: 0  Candida vaginitis -     Fluconazole ; Take 1 tablet (150 mg total) by mouth once for 1 dose.  Dispense: 1 tablet; Refill: 0  Self swab collected.  Wet prep with yeast and clue cells.  Start Flagyl  and Diflucan .  Prevention discussed.  Return if symptoms worsen or fail to improve.   Clotilda JONELLE Single, MD

## 2024-01-23 ENCOUNTER — Telehealth: Payer: Self-pay | Admitting: *Deleted

## 2024-01-23 NOTE — Telephone Encounter (Signed)
 Copied from CRM [id number]. Topic: Clinical - Request for Lab/Test Order >> Jan 23, 2024  2:33 PM Peggy Becker wrote: Reason for CRM: Patient would like to have a Quantiferon gold(blood test completed) per patient she needs this test completed for school.  RA#[phone] (M)

## 2024-01-31 ENCOUNTER — Encounter: Payer: Self-pay | Admitting: Family Medicine

## 2024-01-31 ENCOUNTER — Ambulatory Visit: Admitting: Family Medicine

## 2024-01-31 ENCOUNTER — Other Ambulatory Visit (HOSPITAL_COMMUNITY)
Admission: RE | Admit: 2024-01-31 | Discharge: 2024-01-31 | Disposition: A | Discharge status: Home / Self Care | Source: Ambulatory Visit | Attending: Family Medicine | Admitting: Family Medicine

## 2024-01-31 VITALS — BP 110/74 | HR 94 | Temp 98.4°F | Ht 62.0 in | Wt 136.8 lb

## 2024-01-31 DIAGNOSIS — B9689 Other specified bacterial agents as the cause of diseases classified elsewhere: Secondary | ICD-10-CM

## 2024-01-31 DIAGNOSIS — N898 Other specified noninflammatory disorders of vagina: Secondary | ICD-10-CM | POA: Diagnosis present

## 2024-01-31 DIAGNOSIS — Z111 Encounter for screening for respiratory tuberculosis: Secondary | ICD-10-CM

## 2024-01-31 DIAGNOSIS — N760 Acute vaginitis: Secondary | ICD-10-CM

## 2024-01-31 MED ORDER — METRONIDAZOLE 0.75 % VA GEL: 1.0000 | Freq: Every day | VAGINAL | 0 refills | Status: AC

## 2024-01-31 NOTE — Addendum Note (Signed)
 Addended by: BRIEN SONG A on: 01/31/2024 09:53 AM   Modules accepted: Orders

## 2024-01-31 NOTE — Progress Notes (Signed)
 Established Patient Office Visit   Subjective  Patient ID: Peggy Becker, female    DOB: Sep 21, 1995  Age: 28 y.o. MRN: [id number]  Chief Complaint  Patient presents with   Acute Visit    BV, odor and discharge started after her menses ended    Pt is a 28 yo female seen for acute concern.  Pt with vaginal odor and d/c x 3 days.  Sx started 2 days s/p menses.  Pt notes h/o BV and is concerned it has returned.  Had similar sx start 3 mo ago after menses.  Seen by OB/Gyn and this provider for them.  No changes in soaps, lotions, detergents, or partners.  Denies irritation.   Pt needs tb testing for school.  If has skin test has to have it x 2.  Also requesting copy of immunizations.    Patient Active Problem List   Diagnosis Date Noted   Twin gestation in third trimester - [name] 07/23/2019   Delivery by emergency cesarean 3/9 - abruption TwinB 32.5 wks 07/23/2019   Postpartum care following cesarean delivery - 3/9 07/23/2019   Placental abruption affecting delivery 07/23/2019   Past Medical History:  Diagnosis Date   Medical history non-contributory    Past Surgical History:  Procedure Laterality Date   CESAREAN SECTION N/A 07/23/2019   Procedure: CESAREAN SECTION;  Surgeon: Jayne Vonn DEL, MD;  Location: [facility] LD OR[facility];  Service: Obstetrics;  Laterality: N/A;   NO PAST SURGERIES     Social History   Tobacco Use   Smoking status: Never   Smokeless tobacco: Former  Building services engineer status: Never Used  Substance Use Topics   Alcohol use: No   Drug use: No    Types: Marijuana   History reviewed. No pertinent family history. No Known Allergies  ROS Negative unless stated above    Objective:     BP 110/74 (BP Location: Left Arm, Patient Position: Sitting, Cuff Size: Normal)   Pulse 94   Temp 98.4 F (36.9 C) (Oral)   Ht 5' 2 (1.575 m)   Wt 136 lb 12.8 oz (62.1 kg)   LMP 12/24/2023 (Exact Date)   SpO2 99%   BMI 25.02 kg/m  BP Readings from Last 3  Encounters:  01/31/24 110/74  01/04/24 98/64  06/14/23 100/70   Wt Readings from Last 3 Encounters:  01/31/24 136 lb 12.8 oz (62.1 kg)  01/04/24 136 lb 12.8 oz (62.1 kg)  06/14/23 150 lb 3.2 oz (68.1 kg)      Physical Exam Constitutional:      General: She is not in acute distress.    Appearance: Normal appearance.  HENT:     Head: Normocephalic and atraumatic.     Nose: Nose normal.     Mouth/Throat:     Mouth: Mucous membranes are moist.  Cardiovascular:     Rate and Rhythm: Normal rate and regular rhythm.     Heart sounds: Normal heart sounds. No murmur heard.    No gallop.  Pulmonary:     Effort: Pulmonary effort is normal. No respiratory distress.     Breath sounds: Normal breath sounds. No wheezing, rhonchi or rales.  Genitourinary:    Comments: Aptima Self swab collected. Skin:    General: Skin is warm and dry.  Neurological:     Mental Status: She is alert and oriented to person, place, and time.        01/04/2024    1:34 PM 06/14/2023  9:13 AM 02/28/2020    2:41 PM  Depression screen PHQ 2/9  Decreased Interest 0 0 1  Down, Depressed, Hopeless 0 0 1  PHQ - 2 Score 0 0 2  Altered sleeping 0 2 1  Tired, decreased energy 3 2 2   Change in appetite  0 0  Feeling bad or failure about yourself  0 0 0  Trouble concentrating 0 0 0  Moving slowly or fidgety/restless 0 0 0  Suicidal thoughts 0 0 0  PHQ-9 Score 3 4 5   Difficult doing work/chores Somewhat difficult Somewhat difficult Somewhat difficult      01/04/2024    1:35 PM 06/14/2023    9:13 AM 02/28/2020    2:41 PM  GAD 7 : Generalized Anxiety Score  Nervous, Anxious, on Edge 0 0 3  Control/stop worrying 0 1 3  Worry too much - different things 0 1 3  Trouble relaxing 0 0 2  Restless 0 0 0  Easily annoyed or irritable 0 1 3  Afraid - awful might happen 0 0 0  Total GAD 7 Score 0 3 14  Anxiety Difficulty Not difficult at all Somewhat difficult Somewhat difficult     No results found for any  visits on 01/31/24.    Assessment & Plan:   Vaginal odor -     Cervicovaginal ancillary only -     metroNIDAZOLE ; Place 1 Applicatorful vaginally at bedtime for 5 days.  Dispense: 70 g; Refill: 0  Bacterial vaginosis -     metroNIDAZOLE ; Place 1 Applicatorful vaginally at bedtime for 5 days.  Dispense: 70 g; Refill: 0  Vaginal discharge -     Cervicovaginal ancillary only -     metroNIDAZOLE ; Place 1 Applicatorful vaginally at bedtime for 5 days.  Dispense: 70 g; Refill: 0  Encounter for TB test -     QuantiFERON-TB Gold Plus  Acute vaginal odor and d/c concerning for recurrent BV.  Possibly caused by pH changes from menses.  Aptima self swab collected.  Start metronidazole  gel at bedtime.  Consider boric acid suppositories for ppx after menses.  Continue f/u with OB/Gyn.  Quantiferon Gold testing fo TB screen.  Copy of immunization record provided.  Return if symptoms worsen or fail to improve.   Clotilda JONELLE Single, MD

## 2024-02-01 ENCOUNTER — Ambulatory Visit: Payer: Self-pay | Admitting: Family Medicine

## 2024-02-01 LAB — CERVICOVAGINAL ANCILLARY ONLY
Bacterial Vaginitis (gardnerella): POSITIVE — AB
Candida Glabrata: NEGATIVE
Candida Vaginitis: NEGATIVE
Comment: NEGATIVE
Comment: NEGATIVE
Comment: NEGATIVE
Comment: NEGATIVE
Trichomonas: NEGATIVE

## 2024-02-04 LAB — QUANTIFERON-TB GOLD PLUS
Mitogen-NIL: 7.31 [IU]/mL
NIL: 0.01 [IU]/mL
QuantiFERON-TB Gold Plus: NEGATIVE
TB1-NIL: 0.06 [IU]/mL
TB2-NIL: 0.14 [IU]/mL
